# Patient Record
Sex: Female | Born: 1960
Health system: Southern US, Community
[De-identification: ages and names within clinical notes are randomized; demographics above are authoritative.]

## PROBLEM LIST (undated history)

## (undated) DIAGNOSIS — G8929 Other chronic pain: Secondary | ICD-10-CM

## (undated) DIAGNOSIS — E785 Hyperlipidemia, unspecified: Secondary | ICD-10-CM

## (undated) DIAGNOSIS — R112 Nausea with vomiting, unspecified: Secondary | ICD-10-CM

## (undated) DIAGNOSIS — R011 Cardiac murmur, unspecified: Secondary | ICD-10-CM

## (undated) DIAGNOSIS — Z9889 Other specified postprocedural states: Secondary | ICD-10-CM

## (undated) DIAGNOSIS — R0600 Dyspnea, unspecified: Secondary | ICD-10-CM

## (undated) DIAGNOSIS — Q231 Congenital insufficiency of aortic valve: Secondary | ICD-10-CM

## (undated) DIAGNOSIS — Q248 Other specified congenital malformations of heart: Secondary | ICD-10-CM

## (undated) DIAGNOSIS — I480 Paroxysmal atrial fibrillation: Secondary | ICD-10-CM

## (undated) DIAGNOSIS — I350 Nonrheumatic aortic (valve) stenosis: Secondary | ICD-10-CM

## (undated) DIAGNOSIS — M542 Cervicalgia: Secondary | ICD-10-CM

## (undated) DIAGNOSIS — F419 Anxiety disorder, unspecified: Secondary | ICD-10-CM

## (undated) DIAGNOSIS — Q2381 Bicuspid aortic valve: Secondary | ICD-10-CM

## (undated) DIAGNOSIS — R42 Dizziness and giddiness: Secondary | ICD-10-CM

## (undated) HISTORY — DX: Nonrheumatic aortic (valve) stenosis: I35.0

## (undated) HISTORY — DX: Cervicalgia: M54.2

## (undated) HISTORY — DX: Other chronic pain: G89.29

## (undated) HISTORY — DX: Other specified congenital malformations of heart: Q24.8

## (undated) HISTORY — PX: TUBAL LIGATION: SHX77

## (undated) HISTORY — DX: Bicuspid aortic valve: Q23.81

## (undated) HISTORY — DX: Dizziness and giddiness: R42

## (undated) HISTORY — PX: HERNIA REPAIR: SHX51

## (undated) HISTORY — PX: LAPAROSCOPY: SHX197

## (undated) HISTORY — DX: Cardiac murmur, unspecified: R01.1

## (undated) HISTORY — PX: OTHER SURGICAL HISTORY: SHX169

## (undated) HISTORY — DX: Congenital insufficiency of aortic valve: Q23.1

## (undated) HISTORY — DX: Hyperlipidemia, unspecified: E78.5

## (undated) HISTORY — PX: ENDOMETRIAL ABLATION: SHX621

## (undated) HISTORY — DX: Paroxysmal atrial fibrillation: I48.0

## (undated) HISTORY — PX: KNEE SURGERY: SHX244

---

## 2008-02-11 ENCOUNTER — Encounter: Admission: RE | Admit: 2008-02-11 | Discharge: 2008-02-11 | Payer: Self-pay | Admitting: Obstetrics and Gynecology

## 2008-07-02 ENCOUNTER — Ambulatory Visit (HOSPITAL_COMMUNITY): Admission: RE | Admit: 2008-07-02 | Discharge: 2008-07-02 | Payer: Self-pay | Admitting: Obstetrics and Gynecology

## 2008-11-11 ENCOUNTER — Emergency Department (HOSPITAL_COMMUNITY): Admission: EM | Admit: 2008-11-11 | Discharge: 2008-11-11 | Payer: Self-pay | Admitting: Emergency Medicine

## 2010-06-01 ENCOUNTER — Encounter: Payer: Self-pay | Admitting: Cardiovascular Disease

## 2010-06-14 DIAGNOSIS — I4891 Unspecified atrial fibrillation: Secondary | ICD-10-CM

## 2010-06-14 DIAGNOSIS — Z8679 Personal history of other diseases of the circulatory system: Secondary | ICD-10-CM | POA: Insufficient documentation

## 2010-06-14 DIAGNOSIS — I359 Nonrheumatic aortic valve disorder, unspecified: Secondary | ICD-10-CM

## 2010-06-15 ENCOUNTER — Ambulatory Visit: Payer: Self-pay | Admitting: Cardiovascular Disease

## 2010-06-15 DIAGNOSIS — R0602 Shortness of breath: Secondary | ICD-10-CM

## 2010-06-19 ENCOUNTER — Telehealth: Payer: Self-pay | Admitting: Cardiovascular Disease

## 2010-06-19 ENCOUNTER — Encounter (INDEPENDENT_AMBULATORY_CARE_PROVIDER_SITE_OTHER): Payer: Self-pay | Admitting: *Deleted

## 2010-06-22 ENCOUNTER — Ambulatory Visit: Payer: Self-pay | Admitting: Internal Medicine

## 2010-06-22 ENCOUNTER — Ambulatory Visit: Payer: Self-pay

## 2010-06-22 ENCOUNTER — Ambulatory Visit (HOSPITAL_COMMUNITY): Admission: RE | Admit: 2010-06-22 | Discharge: 2010-06-22 | Payer: Self-pay | Admitting: Cardiovascular Disease

## 2010-06-22 ENCOUNTER — Encounter: Payer: Self-pay | Admitting: Cardiovascular Disease

## 2010-06-23 ENCOUNTER — Ambulatory Visit: Payer: Self-pay

## 2010-06-23 ENCOUNTER — Ambulatory Visit: Payer: Self-pay | Admitting: Cardiovascular Disease

## 2010-07-11 ENCOUNTER — Ambulatory Visit (HOSPITAL_COMMUNITY): Admission: RE | Admit: 2010-07-11 | Discharge: 2010-07-11 | Payer: Self-pay | Admitting: Cardiovascular Disease

## 2010-07-11 ENCOUNTER — Ambulatory Visit: Payer: Self-pay | Admitting: Cardiovascular Disease

## 2010-07-12 ENCOUNTER — Encounter: Payer: Self-pay | Admitting: Cardiovascular Disease

## 2010-09-23 ENCOUNTER — Encounter: Payer: Self-pay | Admitting: Cardiovascular Disease

## 2010-09-24 ENCOUNTER — Encounter: Payer: Self-pay | Admitting: Obstetrics and Gynecology

## 2010-10-03 NOTE — Letter (Signed)
Summary: Clearance Letter  [facility], Main Office  [address]   [place], [place] [address]   Phone: [phone]  Fax: [phone]    June 23, 2010  Re:     Molinda Bailiff Address:   [address]     [place], [place]  [address] DOB:     Jun 02, 1961 MRN:     [id number]     Above named pt may proceed with surgery per Dr Eden Emms.      Sincerely, DR PETER NISHAN/ Scherrie Bateman, LPN

## 2010-10-03 NOTE — Letter (Signed)
Summary: [facility] - Office Note  [facility] - Office Note   Imported By: Marylou Mccoy 06/14/2010 15:25:41  _____________________________________________________________________  External Attachment:    Type:   Image     Comment:   External Document

## 2010-10-03 NOTE — Letter (Signed)
Summary: Appointment - Cardiac MRI  [facility], Main Office  [address]   [place], [place] [address]   Phone: [phone]  Fax: [phone]      June 19, 2010 MRN: [id number]   Jessica [name] [address] [place], [place]  [address]   Dear [name],   We have scheduled the above patient for an appointment for a Cardiac MRI on 06-28-2010 at 3:00 p.m.  Please refer to the below information for the location and instructions for this test:  Location:     [facility]       [address]       [place], [place]  [address] Instructions:    Wilmon Arms at [facility] Outpatient Registration 45 minutes prior to your appointment time.  This will ensure you are in the Radiology Department 30 minutes prior to your appointment.    There are no restrictions for this test you may eat and take medications as usual.  If you need to reschedule this appointment please call at the number listed above.  Sincerely,       Lorne Skeens  [facility] Scheduling Team

## 2010-10-03 NOTE — Progress Notes (Signed)
Summary: Question about surgical clearance  Phone Note Call from Patient Call back at Home Phone [phone] Call back at [phone]   Caller: Patient Summary of Call: Pt have question surgical clearance Initial call taken by: Judie Grieve,  June 19, 2010 2:54 PM  Follow-up for Phone Call        left message to call back Dossie Arbour, RN, BSN  June 19, 2010 4:23 PM Pt states she is hoping to have surgery done prior to July 04, 2010 and thought Dr. Eden Emms had cleared her for this. Office note from 06/15/10 indicates pt has been cleared prior to cardiac testing.  This information given to pt and I told her I would call [facility] and give them this information and fax office note to them. Follow-up by: Dossie Arbour, RN, BSN,  June 19, 2010 4:52 PM  Additional Follow-up for Phone Call Additional follow up Details #1::        Spoke with Pattricia Boss at [facility] and gave her above information and faxed office note to her at [phone].  Additional Follow-up by: Dossie Arbour, RN, BSN,  June 19, 2010 5:03 PM

## 2010-10-03 NOTE — Assessment & Plan Note (Signed)
Summary: np3/surgical clearance/jml   CC:  referal from Dr. Corliss Skains surgical clearance.  History of Present Illness: Jessica [name] is seen today at the request of Dr Margaree Mackintosh for preop clearance.  She has a history of bicuspid AV with stenosis and regurgitation.  Has not had echo in 2 years. Some exertional dyspnea and PVC on ECG.  Stopped smoking 2 weeks ago and is on Chantix.  CRF also include family history with mother being a patient of Dr Alice Reichert.  Needs hernia repair which is low risk surgery.  Has never had her aorta looked at for aortopathy.  Despite smoking and family history with dspnea and PVC has not had a previous treadmill.  Denies SSCP, syncope, edema.  Gets occasional palpitations.    Discussed the natural history of bicuspid valve with her.  No need for SBE.  Needs MRI/MRA to look at Aorta.  Would also recommend ETT to R/O CAD and confimr normal functional capacity in the setting of valvular disease.  F/U echo to reassess gradient, and LV size and function.  Current Problems (verified): 1)  Dyspnea  (ICD-786.05) 2)  Aortic Insufficiency  (ICD-424.1) 3)  Atrial Fibrillation  (ICD-427.31) 4)  Heart Murmur, Hx of  (ICD-V12.50)  Current Medications (verified): 1)  Chantix 0.5 Mg Tabs (Varenicline Tartrate) .... As Directed 2)  Multivitamins   Tabs (Multiple Vitamin) .Marland Kitchen.. 1 Tab By Mouth Once Daily  Allergies (verified): 1)  ! Pcn  Past History:  Past Medical History: Last updated: 06/14/2010 Bicuspid AV: prevously seen in [place].  Moderate AS and mild AR PAF  Past Surgical History: Last updated: 06/14/2010 hernia repair  tubal ligation   Two laparoscopies.   Knee surgeries.   Endometrial ablation.      Family History: Last updated: 06/14/2010   Significant for father and mother both with   hypertension.  Mom and Dad both have a heart attack history also.   Social History: Last updated: 06/15/2010 She has quit smoking last year using Chantix.  Married 3  children Moved from [place] as husband is in Youth worker     Social History: She has quit smoking last year using Chantix.  Married 3 children Moved from [place] as husband is in Youth worker     Review of Systems       Denies fever, malais, weight loss, blurry vision, decreased visual acuity, cough, sputum, hemoptysis, pleuritic pain,  heartburn, abdominal pain, melena, lower extremity edema, claudication, or rash.   Vital Signs:  Patient profile:   50 year old female Height:      64 inches Weight:      133 pounds BMI:     22.91 Pulse rate:   65 / minute Resp:     12 per minute BP sitting:   100 / 80  (left arm)  Vitals Entered By: Kem Parkinson (June 15, 2010 12:18 PM)  Physical Exam  General:  Affect appropriate Healthy:  appears stated age HEENT: normal Neck supple with no adenopathy JVP normal no bruits no thyromegaly Lungs clear with no wheezing and good diaphragmatic motion Heart:  S1/S2 mild AS/AR  murmur,rub, gallop or click PMI normal Abdomen: benighn, BS positve, no tenderness, no AAA no bruit.  No HSM or HJR Distal pulses intact with no bruits No edema Neuro non-focal Skin warm and dry    Impression & Recommendations:  Problem # 1:  AORTIC INSUFFICIENCY (ICD-424.1) Bicuspid valve.  F/U echo and MRI/MRA to check aortic root Orders: Cardiac MRI (Cardiac MRI)  Problem #  2:  DYSPNEA (ICD-786.05) Likely from smoking.  F/U ETT given CRF's and PVC on ECG.  Make sure no ischemia, normal functional capacity and no exercise induced arrythmia Orders: Treadmill (Treadmill)  Problem # 3:  PREOPERATIVE EXAMINATION (ICD-V72.84) Low risk surgery.  Can proceed with this even before cardiac testing done.  Encouraged her to stay off cigarettes as this will help with airway and anesthesia  Other Orders: Echocardiogram (Echo)  Patient Instructions: 1)  Your physician recommends that you schedule a follow-up appointment in: YEAR WITH DR Eden Emms 2)  Your  physician recommends that you continue on your current medications as directed. Please refer to the Current Medication list given to you today. 3)  Your physician has requested that you have an echocardiogram.  Echocardiography is a painless test that uses sound waves to create images of your heart. It provides your doctor with information about the size and shape of your heart and how well your heart's chambers and valves are working.  This procedure takes approximately one hour. There are no restrictions for this procedure. 4)  Your physician has requested that you have an exercise tolerance test.  For further information please visit [email or web address]  Please also follow instruction sheet, as given. 5)  Your physician has requested that you have a cardiac MRI.  Cardiac MRI uses a computer to create images of your heart as it's beating, producing both still and moving pictures of your heart and major blood vessels. For further information please visit  [email or web address]  Please follow the instruction sheet given to you today for more information. AND AORTIC MRA    EKG Report  Procedure date:  06/15/2010  Findings:      NSR 65 PVC Otherwise normal   Appended Document: np3/surgical clearance/jml moderate AS/AR with normal LV  Ok for surgery.  Appended Document: np3/surgical clearance/jml CLEARANCE NOTE FAXED .Zack Seal

## 2010-10-03 NOTE — Miscellaneous (Signed)
  Clinical Lists Changes  Orders: Added new Referral order of MRA (MRA) - Signed

## 2010-10-03 NOTE — Progress Notes (Signed)
Summary: cardiac clearance  Phone Note From Other Clinic   Caller: annie [phone] Request: Talk with Nurse Details of Complaint: status of cardiac clearance Initial call taken by: Lorne Skeens,  June 19, 2010 12:01 PM  Follow-up for Phone Call        spoke w/Annie pt is sch for stress test and echo on 10/21 [facility], RN  June 19, 2010 12:25 PM

## 2010-12-14 LAB — POCT I-STAT, CHEM 8
Creatinine, Ser: 0.9 mg/dL (ref 0.4–1.2)
HCT: 43 % (ref 36.0–46.0)
Hemoglobin: 14.6 g/dL (ref 12.0–15.0)
Potassium: 4 mEq/L (ref 3.5–5.1)
Sodium: 136 mEq/L (ref 135–145)
TCO2: 22 mmol/L (ref 0–100)

## 2010-12-14 LAB — CBC
MCV: 100.5 fL — ABNORMAL HIGH (ref 78.0–100.0)
Platelets: 254 10*3/uL (ref 150–400)
RDW: 13.7 % (ref 11.5–15.5)
WBC: 5.5 10*3/uL (ref 4.0–10.5)

## 2010-12-14 LAB — DIFFERENTIAL
Basophils Absolute: 0.1 10*3/uL (ref 0.0–0.1)
Basophils Relative: 1 % (ref 0–1)
Eosinophils Absolute: 0.1 10*3/uL (ref 0.0–0.7)
Lymphs Abs: 1.5 10*3/uL (ref 0.7–4.0)
Neutrophils Relative %: 62 % (ref 43–77)

## 2010-12-14 LAB — GC/CHLAMYDIA PROBE AMP, GENITAL
Chlamydia, DNA Probe: NEGATIVE
GC Probe Amp, Genital: NEGATIVE

## 2010-12-14 LAB — URINALYSIS, ROUTINE W REFLEX MICROSCOPIC
Glucose, UA: NEGATIVE mg/dL
Hgb urine dipstick: NEGATIVE
Specific Gravity, Urine: 1.027 (ref 1.005–1.030)
Urobilinogen, UA: 0.2 mg/dL (ref 0.0–1.0)

## 2010-12-14 LAB — RPR: RPR Ser Ql: NONREACTIVE

## 2010-12-14 LAB — URINE MICROSCOPIC-ADD ON

## 2011-01-16 NOTE — Op Note (Signed)
NAMEZAYNA, Jessica [name]              ACCOUNT NO.:  [id number]   MEDICAL RECORD NO.:  [id number]          PATIENT TYPE:  AMB   LOCATION:  SDC                           FACILITY:  [facility]   PHYSICIAN:  Duke Salvia. Marcelle [name], M.D.DATE OF BIRTH:  [birth year]   DATE OF PROCEDURE:  07/02/2008  DATE OF DISCHARGE:                               OPERATIVE REPORT   PREOPERATIVE DIAGNOSIS:  Stress urinary incontinence.   POSTOPERATIVE DIAGNOSIS:  Stress urinary incontinence.   PROCEDURE:  Midurethral sling, transobturator.   SURGEON:  Duke Salvia. Marcelle Overlie, MD   ANESTHESIA:  General.   COMPLICATIONS:  None.   DRAINS:  Foley catheter.   BLOOD LOSS:  150.   PROCEDURE AND FINDINGS:  The patient was taken to the operating room.  After an adequate level of general anesthesia was obtained with the legs  in stirrups, the perineum and vagina were prepped and draped with  Betadine.  A Foley catheter was positioned, draining clear urine.  Exam  was carried out at that point and the appropriate needle sites for the  transobturator were marked at the level of clitoris along the medial  margin of the obturator canal.  The midurethral area was infiltrated  with dilute lidocaine with epinephrine.  A 2-cm vertical incision was  made, minimal sharp dissection, finger dissection until I could palpate  the posterior side of the inferior pubic ramus.  The halo needle was  then used to pass from the obturator canal on each side.  After assuring  that there was no buttonholing of vaginal tissue, Foley catheter was  removed and cystoscopy was carried out revealing no evidence of bladder  or urethral injury.  The cystoscope was removed.  The bladder was  deflated with the Foley catheter.  The mesh assembly was pulled through,  appropriate tensioning was made, and the mesh was trimmed.  Needle sites  were closed with Dermabond and the midurethral area was closed with  interrupted 2-0 Vicryl sutures.  This area was  hemostatic.  Clear urine  noted throughout.  She received Cipro IV preop and went to recovery room  in good condition.      Jessica [name], M.D.  Electronically Signed     RMH/MEDQ  D:  07/02/2008  T:  07/02/2008  Job:  [id number]

## 2011-01-16 NOTE — H&P (Signed)
Jessica [name], [name]              ACCOUNT NO.:  [id number]   MEDICAL RECORD NO.:  [id number]          PATIENT TYPE:  AMB   LOCATION:  SDC                           [facility]ILITY:  [facility]   PHYSICIAN:  Duke Salvia. Marcelle Overlie, M.D.DATE OF BIRTH:  1960-11-27   DATE OF ADMISSION:  DATE OF DISCHARGE:                              HISTORY & PHYSICAL   CHIEF COMPLAINT:  Stress urinary incontinence.   HISTORY OF PRESENT ILLNESS:  A 50 year old G3, P3, prior tubal.  I saw  her originally August 2008.  At that time, she was complaining of mild  SUI symptoms.  Her primary concern was menorrhagia.  She underwent a  ThermaChoice EMA in the office September 26, 2007 and has improved  significantly as far as her menorrhagia is concerned, but has continued  to be bothered by urinary incontinence.   Evaluation in our office by urodynamics showed PVR 50 mL.  Her Chem-9  was normal and normal uroflow.  LPPs ranged from 32-141 with an MUCP of  29-30.  There were no uninhibited contractions, normal capacity and she  did leak with a full bladder with Valsalva and cough.  She presents now  for TOT.  This procedure was discussed in detail including risks related  to bleeding, infection, adjacent organ injury, possible complications  including infection or mesh erosion discussed.  The need for postop  catheterization as a possibility along with postop bladder irritability  and her expected recovery time are all reviewed.   ALLERGIES:  PENICILLIN.   PAST SURGERIES:  1. Two laparoscopies.  2. Knee surgeries.  3. Tubal ligation.  4. Endometrial ablation.   SOCIAL HISTORY:  She has quit smoking last year using Chantix.   OBSTETRICAL HISTORY:  Three vaginal deliveries at term without  complication.   FAMILY HISTORY:  Significant for father and mother both with  hypertension.  Mom and Dad both have a heart attack history also.   PHYSICAL EXAMINATION:  VITAL SIGNS:  Temperature 98.2, blood pressure  120/78.  HEENT:  Unremarkable.  NECK:  Supple without masses.  LUNGS:  Clear.  CARDIOVASCULAR:  Regular rate and rhythm without murmurs, rubs or  gallops.  BREASTS:  Without masses.  ABDOMEN:  Soft, flat, nontender.  PELVIC:  Normal external genitalia.  Vagina and _cervix_are  clear.  Uterus midposition, normal size.  Adnexa negative.  EXTREMITIES/NEUROLOGIC:  Unremarkable.   IMPRESSION:  Stress urinary incontinence.   PLAN:  TOT procedure and risks reviewed as above.      Richard M. Marcelle Overlie, M.D.  Electronically Signed     RMH/MEDQ  D:  07/01/2008  T:  07/01/2008  Job:  [id number]

## 2011-03-20 ENCOUNTER — Encounter: Payer: Self-pay | Admitting: Cardiovascular Disease

## 2011-03-21 ENCOUNTER — Ambulatory Visit: Payer: Self-pay | Admitting: Cardiovascular Disease

## 2011-06-05 LAB — CBC
HCT: 42.5
Hemoglobin: 14.4
MCHC: 33.8
MCV: 99.7
RBC: 4.27
WBC: 5.4

## 2011-06-08 ENCOUNTER — Encounter: Payer: Self-pay | Admitting: Cardiovascular Disease

## 2011-06-08 ENCOUNTER — Ambulatory Visit (INDEPENDENT_AMBULATORY_CARE_PROVIDER_SITE_OTHER): Payer: BC Managed Care – PPO | Admitting: Cardiovascular Disease

## 2011-06-08 VITALS — BP 110/80 | HR 64 | Ht 64.0 in | Wt 130.0 lb

## 2011-06-08 DIAGNOSIS — F17200 Nicotine dependence, unspecified, uncomplicated: Secondary | ICD-10-CM

## 2011-06-08 DIAGNOSIS — I350 Nonrheumatic aortic (valve) stenosis: Secondary | ICD-10-CM

## 2011-06-08 DIAGNOSIS — I359 Nonrheumatic aortic valve disorder, unspecified: Secondary | ICD-10-CM

## 2011-06-08 MED ORDER — VARENICLINE TARTRATE 0.5 MG X 11 & 1 MG X 42 PO MISC: ORAL | Status: DC

## 2011-06-08 NOTE — Patient Instructions (Signed)
Your physician has requested that you have an echocardiogram. Echocardiography is a painless test that uses sound waves to create images of your heart. It provides your doctor with information about the size and shape of your heart and how well your heart's chambers and valves are working. This procedure takes approximately one hour. There are no restrictions for this procedure.   Your physician wants you to follow-up in: one year You will receive a reminder letter in the mail two months in advance. If you don't receive a letter, please call our office to schedule the follow-up appointment.   Start chantix as directed

## 2011-06-08 NOTE — Assessment & Plan Note (Signed)
Counseled for less than 10 minutes.  Chantix written for.  Stress from looking after her 2 grandchildren got her smoking again last 2 months.  Daughter has substance abuse problem.

## 2011-06-08 NOTE — Progress Notes (Signed)
Arleigh is seen today at the request of Dr Margaree Mackintosh for preop clearance. She has a history of bicuspid AV with stenosis and regurgitation. Has not had echo in 2 years. Some exertional dyspnea and PVC on ECG. Stopped smoking 2 weeks ago and is on Chantix. CRF also include family history with mother being a patient of Dr Alice Reichert. Needs hernia repair which is low risk surgery. Has never had her aorta looked at for aortopathy. Despite smoking and family history with dspnea and PVC has not had a previous treadmill. Denies SSCP, syncope, edema. Gets occasional palpitations.   Echo 06/22/10 Reviewed  - Left ventricle: The cavity size was normal. Wall thickness was normal. Systolic function was normal. The estimated ejection fraction was in the range of 55% to 60%. Wall motion was normal; there were no regional wall motion abnormalities. Left ventricular diastolic function parameters were normal. - Aortic valve: Bicuspid; mildly calcified leaflets. There was moderate stenosis. Moderate regurgitation. Valve area: 1.47cm^2(VTI). Valve area: 1.27cm^2 (Vmax). - Mitral valve: Mild regurgitation. - Left atrium: The atrium was mildly dilated. - Tricuspid valve: Moderate regurgitation. - Pulmonary arteries: Systolic pressure was mildly to moderately increased. PA peak pressure: 38mm Hg (S).  MRI: 07/11/10  REviewed  Impression:  1) Bicuspid Aortic Valve  2) Moderate dilatation of the ascending aortic root with no discection or ulcer Measures 4.3 x 3.8 cm compared to descending thoracic aorta at 2.2 x 2.2 cm 3) No coarctation of the aorta         ROS: Denies fever, malais, weight loss, blurry vision, decreased visual acuity, cough, sputum, SOB, hemoptysis, pleuritic pain, palpitaitons, heartburn, abdominal pain, melena, lower extremity edema, claudication, or rash.  All other systems reviewed and negative  General: Affect appropriate Healthy:  appears stated age HEENT: normal Neck supple with no  adenopathy JVP normal no bruits no thyromegaly Lungs clear with no wheezing and good diaphragmatic motion Heart:  S1/S2 audible AS  murmur,rub, gallop or click PMI normal Abdomen: benighn, BS positve, no tenderness, no AAA no bruit.  No HSM or HJR Distal pulses intact with no bruits No edema Neuro non-focal Skin warm and dry No muscular weakness   Current Outpatient Prescriptions  Medication Sig Dispense Refill  . multivitamin (THERAGRAN) per tablet Take 1 tablet by mouth daily.          Allergies  Penicillins  Electrocardiogram:  NSR 64 Normal ECG  Assessment and Plan

## 2011-06-08 NOTE — Assessment & Plan Note (Signed)
Bicuspid AV with moderate AS/AR.  F/U echo

## 2011-06-19 ENCOUNTER — Encounter: Payer: Self-pay | Admitting: *Deleted

## 2011-06-20 ENCOUNTER — Ambulatory Visit (HOSPITAL_COMMUNITY): Payer: BC Managed Care – PPO | Attending: Cardiovascular Disease | Admitting: Radiology

## 2011-06-20 DIAGNOSIS — I059 Rheumatic mitral valve disease, unspecified: Secondary | ICD-10-CM

## 2011-06-20 DIAGNOSIS — I079 Rheumatic tricuspid valve disease, unspecified: Secondary | ICD-10-CM | POA: Insufficient documentation

## 2011-06-20 DIAGNOSIS — I350 Nonrheumatic aortic (valve) stenosis: Secondary | ICD-10-CM

## 2011-06-20 DIAGNOSIS — I080 Rheumatic disorders of both mitral and aortic valves: Secondary | ICD-10-CM | POA: Insufficient documentation

## 2011-06-20 DIAGNOSIS — R002 Palpitations: Secondary | ICD-10-CM | POA: Insufficient documentation

## 2011-06-20 DIAGNOSIS — I379 Nonrheumatic pulmonary valve disorder, unspecified: Secondary | ICD-10-CM | POA: Insufficient documentation

## 2011-07-20 ENCOUNTER — Telehealth: Payer: Self-pay | Admitting: Cardiovascular Disease

## 2011-07-20 MED ORDER — VARENICLINE TARTRATE 0.5 MG X 11 & 1 MG X 42 PO MISC: ORAL | Status: DC

## 2011-07-20 NOTE — Telephone Encounter (Signed)
Pt calling regarding RX for chantix and pt said she cannot find RX anywhere. Pt waned to know if RX can be sent over the mail or if pt can come to pick it up in the office. Please return pt call to discuss further.

## 2011-07-20 NOTE — Telephone Encounter (Signed)
Spoke with pt, script mailed to her home address Jessica [name]

## 2011-09-20 ENCOUNTER — Other Ambulatory Visit: Payer: Self-pay | Admitting: *Deleted

## 2011-09-20 MED ORDER — VARENICLINE TARTRATE 1 MG PO TABS: 1.0000 mg | ORAL_TABLET | Freq: Two times a day (BID) | ORAL | Status: AC

## 2012-06-25 ENCOUNTER — Other Ambulatory Visit: Payer: Self-pay | Admitting: Internal Medicine

## 2012-06-25 ENCOUNTER — Other Ambulatory Visit (HOSPITAL_COMMUNITY)
Admission: RE | Admit: 2012-06-25 | Discharge: 2012-06-25 | Disposition: A | Discharge status: Home / Self Care | Payer: BC Managed Care – PPO | Source: Ambulatory Visit | Attending: Internal Medicine | Admitting: Internal Medicine

## 2012-06-25 DIAGNOSIS — Z1151 Encounter for screening for human papillomavirus (HPV): Secondary | ICD-10-CM | POA: Insufficient documentation

## 2012-06-25 DIAGNOSIS — R1900 Intra-abdominal and pelvic swelling, mass and lump, unspecified site: Secondary | ICD-10-CM

## 2012-06-25 DIAGNOSIS — Z01419 Encounter for gynecological examination (general) (routine) without abnormal findings: Secondary | ICD-10-CM | POA: Insufficient documentation

## 2012-06-25 DIAGNOSIS — R0989 Other specified symptoms and signs involving the circulatory and respiratory systems: Secondary | ICD-10-CM

## 2012-07-04 ENCOUNTER — Other Ambulatory Visit: Payer: BC Managed Care – PPO

## 2012-07-07 ENCOUNTER — Other Ambulatory Visit: Payer: BC Managed Care – PPO

## 2012-07-11 ENCOUNTER — Ambulatory Visit
Admission: RE | Admit: 2012-07-11 | Discharge: 2012-07-11 | Disposition: A | Discharge status: Home / Self Care | Payer: BC Managed Care – PPO | Source: Ambulatory Visit | Attending: Internal Medicine | Admitting: Internal Medicine

## 2012-07-11 DIAGNOSIS — R1900 Intra-abdominal and pelvic swelling, mass and lump, unspecified site: Secondary | ICD-10-CM

## 2012-07-11 DIAGNOSIS — R0989 Other specified symptoms and signs involving the circulatory and respiratory systems: Secondary | ICD-10-CM

## 2012-07-17 ENCOUNTER — Encounter: Payer: Self-pay | Admitting: Cardiovascular Disease

## 2012-11-06 ENCOUNTER — Other Ambulatory Visit: Payer: Self-pay | Admitting: Internal Medicine

## 2013-01-12 ENCOUNTER — Other Ambulatory Visit: Payer: BC Managed Care – PPO

## 2013-06-15 ENCOUNTER — Other Ambulatory Visit: Payer: BC Managed Care – PPO

## 2013-06-22 ENCOUNTER — Ambulatory Visit
Admission: RE | Admit: 2013-06-22 | Discharge: 2013-06-22 | Disposition: A | Discharge status: Home / Self Care | Payer: BC Managed Care – PPO | Source: Ambulatory Visit | Attending: Internal Medicine | Admitting: Internal Medicine

## 2013-06-22 DIAGNOSIS — E041 Nontoxic single thyroid nodule: Secondary | ICD-10-CM

## 2013-06-24 ENCOUNTER — Other Ambulatory Visit: Payer: Self-pay

## 2013-06-24 DIAGNOSIS — Z1231 Encounter for screening mammogram for malignant neoplasm of breast: Secondary | ICD-10-CM

## 2013-07-03 ENCOUNTER — Ambulatory Visit (INDEPENDENT_AMBULATORY_CARE_PROVIDER_SITE_OTHER): Payer: BC Managed Care – PPO | Admitting: Cardiovascular Disease

## 2013-07-03 ENCOUNTER — Encounter: Payer: Self-pay | Admitting: Cardiovascular Disease

## 2013-07-03 VITALS — BP 119/76 | HR 69 | Ht 64.0 in | Wt 135.0 lb

## 2013-07-03 DIAGNOSIS — I719 Aortic aneurysm of unspecified site, without rupture: Secondary | ICD-10-CM

## 2013-07-03 DIAGNOSIS — I359 Nonrheumatic aortic valve disorder, unspecified: Secondary | ICD-10-CM

## 2013-07-03 DIAGNOSIS — Z9109 Other allergy status, other than to drugs and biological substances: Secondary | ICD-10-CM | POA: Insufficient documentation

## 2013-07-03 DIAGNOSIS — Q231 Congenital insufficiency of aortic valve: Secondary | ICD-10-CM

## 2013-07-03 DIAGNOSIS — Q2381 Bicuspid aortic valve: Secondary | ICD-10-CM

## 2013-07-03 DIAGNOSIS — I350 Nonrheumatic aortic (valve) stenosis: Secondary | ICD-10-CM

## 2013-07-03 NOTE — Progress Notes (Signed)
Patient ID: Jessica [name], female   DOB: [birth year], 52 y.o.   MRN: [id number] Dennis is seen today at the request of Dr Margaree Mackintosh for preop clearance. She has a history of bicuspid AV with stenosis and regurgitation. Has not had echo in 2 years. Some exertional dyspnea and PVC on ECG. Stopped smoking 2 weeks ago and is on Chantix. CRF also include family history with mother being a patient of Dr Alice Reichert. Needs hernia repair which is low risk surgery. Has never had her aorta looked at for aortopathy. Despite smoking and family history with dspnea and PVC has not had a previous treadmill. Denies SSCP, syncope, edema. Gets occasional palpitations.  Echo  I0/17/12   Study Conclusions  - Left ventricle: The cavity size was normal. Wall thickness was normal. Systolic function was normal. The estimated ejection fraction was in the range of 55% to 60%. - Aortic valve: Likely bicuspid valve moderately calcified There was moderate stenosis. Mild regurgitation. - Mitral valve: Mild regurgitation. - Left atrium: The atrium was mildly dilated. - Atrial septum: No defect or patent foramen ovale was identified.  Bothered by allergies.  Still raising grandkids as daughter in in Rx for heroin   ROS: Denies fever, malais, weight loss, blurry vision, decreased visual acuity, cough, sputum, SOB, hemoptysis, pleuritic pain, palpitaitons, heartburn, abdominal pain, melena, lower extremity edema, claudication, or rash.  All other systems reviewed and negative  General: Affect appropriate Healthy:  appears stated age HEENT: normal  Bronchitic voice Neck supple with no adenopathy JVP normal no bruits no thyromegaly Lungs clear with no wheezing and good diaphragmatic motion Heart:  S1/S2 mild AS murmur, no rub, gallop or click PMI normal Abdomen: benighn, BS positve, no tenderness, no AAA no bruit.  No HSM or HJR Distal pulses intact with no bruits No edema Neuro non-focal Skin warm and dry No muscular  weakness   Current Outpatient Prescriptions  Medication Sig Dispense Refill  . multivitamin (THERAGRAN) per tablet Take 1 tablet by mouth daily.         No current facility-administered medications for this visit.    Allergies  Penicillins  Electrocardiogram:  SR rate 69 normal   Assessment and Plan

## 2013-07-03 NOTE — Assessment & Plan Note (Signed)
Bicuspid AV last echo 2 years ago moderate stenosis with mild AR  F/U echo Discussed need to see dentist twice/year  Dilated ascending aorta 3 years ago 4.3 cm Needs f/u cardiac MRI/ Aortic MRA to size

## 2013-07-03 NOTE — Assessment & Plan Note (Signed)
Resolved NSR on ECG and no palpitations

## 2013-07-03 NOTE — Assessment & Plan Note (Signed)
Fall allergies and ragweed.  Discussed allegra and claritin as alternatives to zyrtec that makes her sleepy

## 2013-07-03 NOTE — Patient Instructions (Signed)
Your physician wants you to follow-up in:  YEAR WITH DR Haywood Filler will receive a reminder letter in the mail two months in advance. If you don't receive a letter, please call our office to schedule the follow-up appointment. Your physician recommends that you continue on your current medications as directed. Please refer to the Current Medication list given to you today.  Your physician has requested that you have an echocardiogram. Echocardiography is a painless test that uses sound waves to create images of your heart. It provides your doctor with information about the size and shape of your heart and how well your heart's chambers and valves are working. This procedure takes approximately one hour. There are no restrictions for this procedure.   Your physician has requested that you have a cardiac MRI. Cardiac MRI uses a computer to create images of your heart as its beating, producing both still and moving pictures of your heart and major blood vessels. For further information please visit [email or web address]. Please follow the instruction sheet given to you today for more information. AND MRA  NEEDS  DONE THIS  YEAR

## 2013-07-07 ENCOUNTER — Ambulatory Visit (INDEPENDENT_AMBULATORY_CARE_PROVIDER_SITE_OTHER): Payer: BC Managed Care – PPO | Admitting: General Surgery

## 2013-07-17 ENCOUNTER — Ambulatory Visit (HOSPITAL_COMMUNITY): Payer: BC Managed Care – PPO | Attending: Cardiology | Admitting: Radiology

## 2013-07-17 ENCOUNTER — Encounter: Payer: Self-pay | Admitting: Cardiology

## 2013-07-17 DIAGNOSIS — Q231 Congenital insufficiency of aortic valve: Secondary | ICD-10-CM | POA: Insufficient documentation

## 2013-07-17 DIAGNOSIS — I350 Nonrheumatic aortic (valve) stenosis: Secondary | ICD-10-CM

## 2013-07-17 DIAGNOSIS — I719 Aortic aneurysm of unspecified site, without rupture: Secondary | ICD-10-CM | POA: Insufficient documentation

## 2013-07-17 DIAGNOSIS — F17200 Nicotine dependence, unspecified, uncomplicated: Secondary | ICD-10-CM | POA: Insufficient documentation

## 2013-07-17 DIAGNOSIS — I359 Nonrheumatic aortic valve disorder, unspecified: Secondary | ICD-10-CM

## 2013-07-17 DIAGNOSIS — I079 Rheumatic tricuspid valve disease, unspecified: Secondary | ICD-10-CM | POA: Insufficient documentation

## 2013-07-17 DIAGNOSIS — Q2381 Bicuspid aortic valve: Secondary | ICD-10-CM

## 2013-07-17 DIAGNOSIS — R0602 Shortness of breath: Secondary | ICD-10-CM

## 2013-07-17 NOTE — Progress Notes (Signed)
Echocardiogram performed.  

## 2013-07-21 ENCOUNTER — Encounter: Payer: Self-pay | Admitting: Cardiovascular Disease

## 2013-07-24 ENCOUNTER — Ambulatory Visit (HOSPITAL_COMMUNITY): Payer: BC Managed Care – PPO

## 2013-07-27 ENCOUNTER — Ambulatory Visit
Admission: RE | Admit: 2013-07-27 | Discharge: 2013-07-27 | Disposition: A | Discharge status: Home / Self Care | Payer: BC Managed Care – PPO | Source: Ambulatory Visit

## 2013-07-27 DIAGNOSIS — Z1231 Encounter for screening mammogram for malignant neoplasm of breast: Secondary | ICD-10-CM

## 2013-08-05 ENCOUNTER — Ambulatory Visit (HOSPITAL_COMMUNITY)
Admission: RE | Admit: 2013-08-05 | Discharge: 2013-08-05 | Disposition: A | Discharge status: Home / Self Care | Payer: BC Managed Care – PPO | Source: Ambulatory Visit | Attending: Cardiovascular Disease | Admitting: Cardiovascular Disease

## 2013-08-05 ENCOUNTER — Ambulatory Visit (HOSPITAL_COMMUNITY): Payer: BC Managed Care – PPO

## 2013-08-05 DIAGNOSIS — Q231 Congenital insufficiency of aortic valve: Secondary | ICD-10-CM

## 2013-08-05 DIAGNOSIS — I359 Nonrheumatic aortic valve disorder, unspecified: Secondary | ICD-10-CM

## 2013-08-05 DIAGNOSIS — I77819 Aortic ectasia, unspecified site: Secondary | ICD-10-CM | POA: Insufficient documentation

## 2013-08-05 DIAGNOSIS — I719 Aortic aneurysm of unspecified site, without rupture: Secondary | ICD-10-CM

## 2013-08-05 DIAGNOSIS — I350 Nonrheumatic aortic (valve) stenosis: Secondary | ICD-10-CM

## 2013-08-05 LAB — CREATININE, SERUM
Creatinine, Ser: 0.81 mg/dL (ref 0.50–1.10)
GFR calc non Af Amer: 83 mL/min — ABNORMAL LOW (ref >90)

## 2013-08-05 MED ORDER — GADOBENATE DIMEGLUMINE 529 MG/ML IV SOLN
20.0000 mL | Freq: Once | INTRAVENOUS | Status: AC
  Administered 2013-08-05: 20 mL via INTRAVENOUS

## 2013-08-06 ENCOUNTER — Ambulatory Visit (INDEPENDENT_AMBULATORY_CARE_PROVIDER_SITE_OTHER): Payer: BC Managed Care – PPO | Admitting: General Surgery

## 2013-08-18 ENCOUNTER — Encounter (INDEPENDENT_AMBULATORY_CARE_PROVIDER_SITE_OTHER): Payer: Self-pay

## 2013-08-18 ENCOUNTER — Ambulatory Visit (INDEPENDENT_AMBULATORY_CARE_PROVIDER_SITE_OTHER): Payer: BC Managed Care – PPO | Admitting: General Surgery

## 2013-08-18 ENCOUNTER — Encounter (INDEPENDENT_AMBULATORY_CARE_PROVIDER_SITE_OTHER): Payer: Self-pay | Admitting: General Surgery

## 2013-08-18 VITALS — BP 112/80 | HR 71 | Temp 97.0°F | Resp 16 | Ht 64.0 in | Wt 134.8 lb

## 2013-08-18 DIAGNOSIS — K429 Umbilical hernia without obstruction or gangrene: Secondary | ICD-10-CM

## 2013-08-18 NOTE — Progress Notes (Signed)
Patient ID: Jessica [name], female   DOB: April 14, 1961, 52 y.o.   MRN: [id number]  No chief complaint on file.   HPI Jessica [name] is a 52 y.o. female.  The patient is a 52 year old female who is referred by Dr. Renne Crigler for evaluation of a recurrent umbilical hernia. He states that she can have this fixed previously and states that it was likely not repaired with a mesh  And only repaired with a suture. The patient she has minimal pain at this time. The patient like to have her hernia fixed prior to the end of the year.  Patient recently saw Dr. Eden Emms 07/04/23 .  HPI  Past Medical History  Diagnosis Date  . Bicuspid aortic valve     Previously seen in [place]; moderate AS and mild AR  . Paroxysmal atrial fibrillation   . Heart murmur     Past Surgical History  Procedure Laterality Date  . Hernia repair    . Tubal ligation    . Laparoscopy      x2  . Knee surgery      Multiple  . Endometrial ablation      Family History  Problem Relation Age of Onset  . Hypertension Mother   . Heart attack Mother   . Hypertension Father   . Heart attack Father     Social History History  Substance Use Topics  . Smoking status: Current Some Day Smoker    Last Attempt to Quit: 09/03/2009  . Smokeless tobacco: Not on file     Comment: Quit using Chantix.  Marland Kitchen Alcohol Use: Yes    Allergies  Allergen Reactions  . Penicillins     REACTION: hives    Current Outpatient Prescriptions  Medication Sig Dispense Refill  . multivitamin (THERAGRAN) per tablet Take 1 tablet by mouth daily.         No current facility-administered medications for this visit.    Review of Systems Review of Systems  Constitutional: Negative.   HENT: Negative.   Respiratory: Negative.   Cardiovascular: Negative.   Gastrointestinal: Negative.   Neurological: Negative.   All other systems reviewed and are negative.    Blood pressure 112/80, pulse 71, temperature 97 F (36.1 C), temperature source  Temporal, resp. rate 16, height 5\' 4"  (1.626 m), weight 134 lb 12.8 oz (61.145 kg).  Physical Exam Physical Exam  Constitutional: She is oriented to person, place, and time. She appears well-developed and well-nourished.  HENT:  Head: Normocephalic and atraumatic.  Eyes: Conjunctivae and EOM are normal. Pupils are equal, round, and reactive to light.  Neck: Normal range of motion. Neck supple.  Cardiovascular: Normal rate, regular rhythm and normal heart sounds.   Pulmonary/Chest: Effort normal and breath sounds normal.  Abdominal: Soft. Bowel sounds are normal. There is no tenderness. There is no rebound and no guarding.    Musculoskeletal: Normal range of motion.  Neurological: She is alert and oriented to person, place, and time.  Skin: Skin is warm and dry.  Psychiatric: She has a normal mood and affect.    Data Reviewed none  Assessment    52 year old female with a recurrent umbilical hernia     Plan    1. We will  Have a clearance form sent to Dr. Fabio Bering  2. Once this has been received we can schedule patient opened up in the year. Should she not be able to sit up near she would like to wait another one to 2 years. I  did not see any immediate urgency to get this fixed at this time as it is small and given her minimal pain. 3. If we are able to get this scheduled to proceed with a laparoscopic umbilical hernia repair with mesh.  4. All risks and benefits were discussed with the patient, to generally include infection, bleeding, damage to surrounding structures, acute and chronic nerve pain, and recurrence. Alternatives were offered and described.  All questions were answered and the patient voiced understanding of the procedure and wishes to proceed at this point.        Marigene Ehlers., Savir Blanke 08/18/2013, 3:34 PM

## 2013-08-19 ENCOUNTER — Encounter (INDEPENDENT_AMBULATORY_CARE_PROVIDER_SITE_OTHER): Payer: Self-pay

## 2013-08-19 NOTE — Progress Notes (Signed)
Message     Ok for surgery        ----- Message -----    From: Maryan Puls, CMA    Sent: 08/18/2013 3:51 PM    To: Wendall Stade, MD                         Charisse March Snead Description: 52 year old female  08/18/2013 Letter (Out) Provider: Maryan Puls, CMA  MRN: [id number] Department: [facility]          Clinical Letter Summary              Letters     Letter Information       Status     Maryan Puls on 08/18/2013 Documentation: Pre-Op Cardiac Clearance Sent                  Patient Demographics     Address Phone    [address] [place] [address] [phone] [facility]) [phone] (Mobile)

## 2014-09-28 ENCOUNTER — Other Ambulatory Visit (HOSPITAL_COMMUNITY)
Admission: RE | Admit: 2014-09-28 | Discharge: 2014-09-28 | Disposition: A | Discharge status: Home / Self Care | Payer: BLUE CROSS/BLUE SHIELD | Source: Ambulatory Visit | Attending: Internal Medicine | Admitting: Internal Medicine

## 2014-09-28 DIAGNOSIS — Z01419 Encounter for gynecological examination (general) (routine) without abnormal findings: Secondary | ICD-10-CM | POA: Diagnosis present

## 2014-09-28 DIAGNOSIS — R87810 Cervical high risk human papillomavirus (HPV) DNA test positive: Secondary | ICD-10-CM | POA: Insufficient documentation

## 2014-09-28 DIAGNOSIS — Z1151 Encounter for screening for human papillomavirus (HPV): Secondary | ICD-10-CM | POA: Insufficient documentation

## 2014-11-24 ENCOUNTER — Ambulatory Visit: Payer: BLUE CROSS/BLUE SHIELD | Admitting: *Deleted

## 2015-01-30 NOTE — Progress Notes (Signed)
Patient ID: Jessica [name], female   DOB: 03-18-1961, 54 y.o.   MRN: [id number] Jessica [name] is seen today for f/u aortic stenosis . She has a history of bicuspid AV with stenosis and regurgitation. Has not had echo in 2 years.  CRF also include family history with mother being a patient of Dr Dinah Beers.  Echo 07/17/13 reviewed: Study Conclusions  - Left ventricle: The cavity size was normal. Wall thickness was normal. Systolic function was normal. The estimated ejection fraction was in the range of 60% to 65%. Wall motion was normal; there were no regional wall motion abnormalities. Doppler parameters are consistent with abnormal left ventricular relaxation (grade 1 diastolic dysfunction). - Aortic valve: Bicuspid; moderately calcified leaflets. There was moderate stenosis. Mild regurgitation. Mean gradient: 17mm Hg (S). Peak gradient: 53mm Hg (S). Valve area: 1cm^2(VTI). - Aorta: Mildly dilated ascending aorta. Ascending aortic diameter: 55mm (S). - Mitral valve: No significant regurgitation. - Left atrium: The atrium was mildly dilated. - Right ventricle: The cavity size was normal. Systolic function was normal. - Tricuspid valve: Peak RV-RA gradient: 34mm Hg (S). - Pulmonary arteries: PA systolic pressure 21-97 mmHg. - Systemic veins: The IVC measured 1.9 with normal respirophasic variation, suggesting RA pressure 6-10 mmHg  MRI 08/05/13 reviewed IMPRESSION: 1) Mild ascending aortic root dilatation 4.2 cm stable from study done 2011  2) Bicuspid aortic valve Moderate stenosis by echo with mild AR  3) Normal LV size and function EF 59%  4) No coarctation with bovine arch  5) No hyperenhancement or scar tissue in LV myocardium   ROS: Denies fever, malais, weight loss, blurry vision, decreased visual acuity, cough, sputum, SOB, hemoptysis, pleuritic pain, palpitaitons, heartburn, abdominal pain, melena, lower extremity edema, claudication, or rash.  All  other systems reviewed and negative  General: Affect appropriate Healthy:  appears stated age 55: normal  Bronchitic voice Neck supple with no adenopathy JVP normal no bruits no thyromegaly Lungs clear with no wheezing and good diaphragmatic motion Heart:  S1/S2 moderate  AS murmur, no rub, gallop or click PMI normal Abdomen: benighn, BS positve, no tenderness, no AAA no bruit.  No HSM or HJR Distal pulses intact with no bruits No edema Neuro non-focal Skin warm and dry No muscular weakness   Current Outpatient Prescriptions  Medication Sig Dispense Refill  . multivitamin (THERAGRAN) per tablet Take 1 tablet by mouth daily.       No current facility-administered medications for this visit.    Allergies  Penicillins  Electrocardiogram:  07/03/13 SR rate 69 normal   Assessment and Plan

## 2015-02-02 ENCOUNTER — Encounter: Payer: BLUE CROSS/BLUE SHIELD | Admitting: Cardiovascular Disease

## 2015-04-20 ENCOUNTER — Ambulatory Visit: Payer: BLUE CROSS/BLUE SHIELD | Admitting: Cardiovascular Disease

## 2015-04-26 NOTE — Progress Notes (Signed)
No Show

## 2015-04-28 ENCOUNTER — Encounter: Payer: BLUE CROSS/BLUE SHIELD | Admitting: Cardiovascular Disease

## 2015-04-28 ENCOUNTER — Encounter: Payer: Self-pay | Admitting: Cardiovascular Disease

## 2015-05-03 ENCOUNTER — Telehealth: Payer: Self-pay | Admitting: Cardiovascular Disease

## 2015-05-03 ENCOUNTER — Encounter: Payer: Self-pay | Admitting: Cardiovascular Disease

## 2015-05-03 NOTE — Telephone Encounter (Signed)
Error //intended to send letter// started tel note by accident//will close

## 2015-07-20 ENCOUNTER — Other Ambulatory Visit: Payer: Self-pay | Admitting: *Deleted

## 2015-07-20 ENCOUNTER — Encounter: Payer: Self-pay | Admitting: Cardiovascular Disease

## 2015-07-20 DIAGNOSIS — I359 Nonrheumatic aortic valve disorder, unspecified: Secondary | ICD-10-CM

## 2015-07-20 NOTE — Progress Notes (Signed)
Patient ID: Jessica [name], female   DOB: 05-15-1961, 54 y.o.   MRN: [id number] 54 y.o. female first seen 2014 for preop clearance . She has a history of bicuspid AV with stenosis and regurgitation.  Some exertional dyspnea and PVC on ECG. Stopped smoking 2 weeks ago and is on Chantix. CRF also include family history with mother being a patient of Dr Dinah Beers. Needs hernia repair which is low risk surgery. Has never had her aorta looked at for aortopathy. Despite smoking and family history with dspnea and PVC has not had a previous treadmill. Denies SSCP, syncope, edema. Gets occasional palpitations.  Echo  07/2013  Study Conclusions  - Left ventricle: The cavity size was normal. Wall thickness was normal. Systolic function was normal. The estimated ejection fraction was in the range of 60% to 65%. Wall motion was normal; there were no regional wall motion abnormalities. Doppler parameters are consistent with abnormal left ventricular relaxation (grade 1 diastolic dysfunction). - Aortic valve: Bicuspid; moderately calcified leaflets. There was moderate stenosis. Mild regurgitation. Mean gradient: 38mm Hg (S). Peak gradient: 39mm Hg (S). Valve area: 1cm^2(VTI). - Aorta: Mildly dilated ascending aorta. Ascending aortic diameter: 31mm (S). - Mitral valve: No significant regurgitation. - Left atrium: The atrium was mildly dilated. - Right ventricle: The cavity size was normal. Systolic function was normal. - Tricuspid valve: Peak RV-RA gradient: 51mm Hg (S). - Pulmonary arteries: PA systolic pressure 123456 mmHg. - Systemic veins: The IVC measured 1.9 with normal respirophasic variation, suggesting RA pressure 6-10 mmHg. Impressions:  - Normal LV size and systolic function, EF 123456. Bicuspid aortic valve with mild AI, moderate AS. Normal RV size and systolic function.  Bothered by allergies.  Still raising grandkids as daughter in in Rx for heroin    Reviewed echo from today 07/21/15  EF normal moderate AR moderate AS mean gradient 27 mmHg/ peak 44 mmHg actually lower than previous  Has had episodes of dizziness and presyncope driving  Gets headache with these  Quit smoking   ROS: Denies fever, malais, weight loss, blurry vision, decreased visual acuity, cough, sputum, SOB, hemoptysis, pleuritic pain, palpitaitons, heartburn, abdominal pain, melena, lower extremity edema, claudication, or rash.  All other systems reviewed and negative  General: Affect appropriate Healthy:  appears stated age 11: normal  Bronchitic voice Neck supple with no adenopathy JVP normal no bruits no thyromegaly Lungs clear with no wheezing and good diaphragmatic motion Heart:  S1/S2  AS murmur, no rub, gallop or click PMI normal Abdomen: benighn, BS positve, no tenderness, no AAA no bruit.  No HSM or HJR Distal pulses intact with no bruits No edema Neuro non-focal Skin warm and dry No muscular weakness   Current Outpatient Prescriptions  Medication Sig Dispense Refill  . multivitamin (THERAGRAN) per tablet Take 1 tablet by mouth daily.       No current facility-administered medications for this visit.    Allergies  Penicillins  Electrocardiogram:   2014  SR rate 69 normal   Assessment and Plan Bicuspid AV:  Moderate AS/AR  Stable f/u MRI/MRA root next year PreSyncope:  MRI/MRA head to r/o aneurysm in association with bicuspid aortic vlave Smoking:  Congratulated her on quitting lungs clear no active wheezing still needs yearly CXR for 5 years   [employer]

## 2015-07-21 ENCOUNTER — Encounter: Payer: Self-pay | Admitting: Cardiovascular Disease

## 2015-07-21 ENCOUNTER — Other Ambulatory Visit: Payer: Self-pay

## 2015-07-21 ENCOUNTER — Ambulatory Visit (HOSPITAL_COMMUNITY): Payer: BLUE CROSS/BLUE SHIELD | Attending: Cardiovascular Disease

## 2015-07-21 ENCOUNTER — Ambulatory Visit (INDEPENDENT_AMBULATORY_CARE_PROVIDER_SITE_OTHER): Payer: BLUE CROSS/BLUE SHIELD | Admitting: Cardiovascular Disease

## 2015-07-21 VITALS — BP 106/80 | HR 56 | Ht 64.0 in | Wt 131.4 lb

## 2015-07-21 DIAGNOSIS — I340 Nonrheumatic mitral (valve) insufficiency: Secondary | ICD-10-CM | POA: Diagnosis not present

## 2015-07-21 DIAGNOSIS — I359 Nonrheumatic aortic valve disorder, unspecified: Secondary | ICD-10-CM | POA: Insufficient documentation

## 2015-07-21 DIAGNOSIS — I350 Nonrheumatic aortic (valve) stenosis: Secondary | ICD-10-CM

## 2015-07-21 DIAGNOSIS — R42 Dizziness and giddiness: Secondary | ICD-10-CM | POA: Diagnosis not present

## 2015-07-21 DIAGNOSIS — I352 Nonrheumatic aortic (valve) stenosis with insufficiency: Secondary | ICD-10-CM | POA: Diagnosis not present

## 2015-07-21 DIAGNOSIS — I071 Rheumatic tricuspid insufficiency: Secondary | ICD-10-CM | POA: Diagnosis not present

## 2015-07-21 DIAGNOSIS — Z01812 Encounter for preprocedural laboratory examination: Secondary | ICD-10-CM | POA: Diagnosis not present

## 2015-07-21 DIAGNOSIS — I517 Cardiomegaly: Secondary | ICD-10-CM | POA: Diagnosis not present

## 2015-07-21 DIAGNOSIS — R55 Syncope and collapse: Secondary | ICD-10-CM | POA: Diagnosis not present

## 2015-07-21 NOTE — Patient Instructions (Addendum)
Medication Instructions:  Your physician recommends that you continue on your current medications as directed. Please refer to the Current Medication list given to you today.   Labwork: TODAY BMET   Testing/Procedures:.  Your physician has requested that you have a cardiac MRI. Cardiac MRI uses a computer to create images of your heart as its beating, producing both still and moving pictures of your heart and major blood vessels. For further information please visit [email or web address] Please follow the instruction sheet given to you today for more information.  MRI  OF HEAD Follow-Up: Your physician wants you to follow-up in:   [place] will receive a reminder letter in the mail two months in advance. If you don't receive a letter, please call our office to schedule the follow-up appointment.  Any Other Special Instructions Will Be Listed Below (If Applicable).     If you need a refill on your cardiac medications before your next appointment, please call your pharmacy.

## 2015-07-21 NOTE — Addendum Note (Signed)
Addended by: Devra Dopp E on: 07/21/2015 11:44 AM   Modules accepted: Orders

## 2015-07-22 ENCOUNTER — Ambulatory Visit: Payer: BLUE CROSS/BLUE SHIELD | Admitting: Cardiovascular Disease

## 2015-07-26 ENCOUNTER — Other Ambulatory Visit: Payer: Self-pay | Admitting: *Deleted

## 2015-08-03 ENCOUNTER — Encounter: Payer: Self-pay | Admitting: Cardiovascular Disease

## 2015-08-04 ENCOUNTER — Other Ambulatory Visit: Payer: Self-pay | Admitting: *Deleted

## 2015-08-04 DIAGNOSIS — R42 Dizziness and giddiness: Secondary | ICD-10-CM

## 2015-08-09 ENCOUNTER — Ambulatory Visit (HOSPITAL_COMMUNITY)
Admission: RE | Admit: 2015-08-09 | Discharge: 2015-08-09 | Disposition: A | Discharge status: Home / Self Care | Payer: BLUE CROSS/BLUE SHIELD | Source: Ambulatory Visit | Attending: Cardiovascular Disease | Admitting: Cardiovascular Disease

## 2015-08-09 DIAGNOSIS — R42 Dizziness and giddiness: Secondary | ICD-10-CM | POA: Insufficient documentation

## 2015-08-09 DIAGNOSIS — Q231 Congenital insufficiency of aortic valve: Secondary | ICD-10-CM | POA: Insufficient documentation

## 2015-08-09 DIAGNOSIS — I352 Nonrheumatic aortic (valve) stenosis with insufficiency: Secondary | ICD-10-CM | POA: Diagnosis not present

## 2015-08-09 DIAGNOSIS — R55 Syncope and collapse: Secondary | ICD-10-CM

## 2015-08-09 DIAGNOSIS — I77810 Thoracic aortic ectasia: Secondary | ICD-10-CM | POA: Insufficient documentation

## 2015-08-09 DIAGNOSIS — I350 Nonrheumatic aortic (valve) stenosis: Secondary | ICD-10-CM | POA: Diagnosis not present

## 2015-08-09 MED ORDER — GADOBENATE DIMEGLUMINE 529 MG/ML IV SOLN
20.0000 mL | Freq: Once | INTRAVENOUS | Status: AC | PRN
  Administered 2015-08-09: 20 mL via INTRAVENOUS

## 2015-10-25 ENCOUNTER — Ambulatory Visit: Payer: BLUE CROSS/BLUE SHIELD | Admitting: Neurology

## 2015-10-25 ENCOUNTER — Telehealth: Payer: Self-pay

## 2015-10-25 NOTE — Telephone Encounter (Signed)
[facility] same day of appt

## 2016-06-11 DIAGNOSIS — M7042 Prepatellar bursitis, left knee: Secondary | ICD-10-CM | POA: Diagnosis not present

## 2016-06-15 DIAGNOSIS — M7042 Prepatellar bursitis, left knee: Secondary | ICD-10-CM | POA: Diagnosis not present

## 2016-07-02 DIAGNOSIS — M7042 Prepatellar bursitis, left knee: Secondary | ICD-10-CM | POA: Diagnosis not present

## 2016-08-06 DIAGNOSIS — M7052 Other bursitis of knee, left knee: Secondary | ICD-10-CM | POA: Diagnosis not present

## 2016-08-06 DIAGNOSIS — M25562 Pain in left knee: Secondary | ICD-10-CM | POA: Diagnosis not present

## 2016-11-20 ENCOUNTER — Encounter: Payer: Self-pay | Admitting: Gastroenterology

## 2016-11-20 DIAGNOSIS — L918 Other hypertrophic disorders of the skin: Secondary | ICD-10-CM | POA: Diagnosis not present

## 2016-11-20 DIAGNOSIS — Z0000 Encounter for general adult medical examination without abnormal findings: Secondary | ICD-10-CM | POA: Diagnosis not present

## 2016-11-23 DIAGNOSIS — N390 Urinary tract infection, site not specified: Secondary | ICD-10-CM | POA: Diagnosis not present

## 2016-11-23 DIAGNOSIS — Z0000 Encounter for general adult medical examination without abnormal findings: Secondary | ICD-10-CM | POA: Diagnosis not present

## 2016-11-30 ENCOUNTER — Encounter (INDEPENDENT_AMBULATORY_CARE_PROVIDER_SITE_OTHER): Payer: Self-pay

## 2016-11-30 ENCOUNTER — Ambulatory Visit (INDEPENDENT_AMBULATORY_CARE_PROVIDER_SITE_OTHER): Payer: BLUE CROSS/BLUE SHIELD | Admitting: Cardiovascular Disease

## 2016-11-30 ENCOUNTER — Encounter: Payer: Self-pay | Admitting: Cardiovascular Disease

## 2016-11-30 VITALS — BP 120/76 | HR 70 | Ht 64.0 in | Wt 132.8 lb

## 2016-11-30 DIAGNOSIS — I359 Nonrheumatic aortic valve disorder, unspecified: Secondary | ICD-10-CM | POA: Diagnosis not present

## 2016-11-30 DIAGNOSIS — I350 Nonrheumatic aortic (valve) stenosis: Secondary | ICD-10-CM

## 2016-11-30 NOTE — Patient Instructions (Addendum)
Medication Instructions:  Your physician recommends that you continue on your current medications as directed. Please refer to the Current Medication list given to you today.  Labwork: NONE  Testing/Procedures: Your physician has requested that you have an echocardiogram. Echocardiography is a painless test that uses sound waves to create images of your heart. It provides your doctor with information about the size and shape of your heart and how well your heart's chambers and valves are working. This procedure takes approximately one hour. There are no restrictions for this procedure.  Follow-Up: Your physician wants you to follow-up in: November with Dr. Johnsie Cancel. You will receive a reminder letter in the mail two months in advance. If you don't receive a letter, please call our office to schedule the follow-up appointment.   If you need a refill on your cardiac medications before your next appointment, please call your pharmacy.

## 2016-11-30 NOTE — Progress Notes (Signed)
Patient ID: Jessica [name], female   DOB: 12-Jan-1961, 56 y.o.   MRN: [id number]   56 y.o. female first seen 2014 for preop clearance . She has a history of bicuspid AV with stenosis and regurgitation.  Some exertional dyspnea and PVC on ECG. Stopped smoking 2 years  ago   CRF also include family history with mother being a patient of Dr Dinah Beers. History of benign PVC   Echo  07/21/15  Reviewed moderate AS and mild / moderate AR  Study Conclusions  - Left ventricle: The cavity size was normal. There was mild   concentric hypertrophy. Systolic function was normal. The   estimated ejection fraction was in the range of 60% to 65%. Wall   motion was normal; there were no regional wall motion   abnormalities. Left ventricular diastolic function parameters   were normal. - Aortic valve: Bicuspid; normal thickness, severely calcified   leaflets. Valve mobility was restricted. Transvalvular velocity   was increased. There was moderate stenosis. There was mild to   moderate regurgitation. Peak velocity (S): 332 cm/s. Mean   gradient (S): 26 mm Hg. Valve area (VTI): 0.99 cm^2. Valve area   (Vmax): 0.96 cm^2. Valve area (Vmean): 0.86 cm^2. - Mitral valve: There was mild regurgitation. - Left atrium: The atrium was mildly dilated. - Right ventricle: The cavity size was normal. Wall thickness was   normal. Systolic function was normal. - Tricuspid valve: There was mild regurgitation. - Inferior vena cava: The vessel was small, appearing collapsed,   consistent with low central venous pressure. The respirophasic   diameter changes were in the normal range (= 50%), consistent   with normal central venous pressure.   Mean gradient 27 mmHg/ peak 44 mmHg   Quit smoking  Daughter is out of heroin rehab and has custody of her 3 kids again   ROS: Denies fever, malais, weight loss, blurry vision, decreased visual acuity, cough, sputum, SOB, hemoptysis, pleuritic pain, palpitaitons, heartburn,  abdominal pain, melena, lower extremity edema, claudication, or rash.  All other systems reviewed and negative  General: Affect appropriate Healthy:  appears stated age 39: normal  Bronchitic voice Neck supple with no adenopathy JVP normal no bruits no thyromegaly Lungs clear with no wheezing and good diaphragmatic motion Heart:  S1/S2  AS murmur, no rub, gallop or click PMI normal Abdomen: benighn, BS positve, no tenderness, no AAA no bruit.  No HSM or HJR Distal pulses intact with no bruits No edema Neuro non-focal Skin warm and dry No muscular weakness   Current Outpatient Prescriptions  Medication Sig Dispense Refill  . multivitamin (THERAGRAN) per tablet Take 1 tablet by mouth daily.      . sertraline (ZOLOFT) 100 MG tablet Take 100 mg by mouth daily.     No current facility-administered medications for this visit.     Allergies  Penicillins  Electrocardiogram:   2014  SR rate 69 normal   11/30/16 SR rate 67 normal   Assessment and Plan Bicuspid AV:  Moderate AS/AR   f/u echo in November will repeat MRA/MRI To resize aortic root which was 4.4 cm in 2016  Smoking:  Congratulated her on quitting lungs clear no active wheezing still needs yearly CXR for 5 years   [employer]

## 2016-12-04 DIAGNOSIS — J45909 Unspecified asthma, uncomplicated: Secondary | ICD-10-CM | POA: Diagnosis not present

## 2016-12-14 ENCOUNTER — Ambulatory Visit (HOSPITAL_COMMUNITY): Payer: BLUE CROSS/BLUE SHIELD | Attending: Cardiovascular Disease

## 2016-12-14 ENCOUNTER — Other Ambulatory Visit: Payer: Self-pay

## 2016-12-14 DIAGNOSIS — I359 Nonrheumatic aortic valve disorder, unspecified: Secondary | ICD-10-CM | POA: Diagnosis not present

## 2016-12-14 DIAGNOSIS — Q231 Congenital insufficiency of aortic valve: Secondary | ICD-10-CM | POA: Insufficient documentation

## 2016-12-14 DIAGNOSIS — I350 Nonrheumatic aortic (valve) stenosis: Secondary | ICD-10-CM

## 2016-12-18 ENCOUNTER — Encounter: Payer: Self-pay | Admitting: Cardiovascular Disease

## 2016-12-18 ENCOUNTER — Ambulatory Visit (INDEPENDENT_AMBULATORY_CARE_PROVIDER_SITE_OTHER): Payer: BLUE CROSS/BLUE SHIELD | Admitting: Cardiovascular Disease

## 2016-12-18 VITALS — BP 118/80 | HR 73 | Ht 64.0 in | Wt 131.4 lb

## 2016-12-18 DIAGNOSIS — Z01812 Encounter for preprocedural laboratory examination: Secondary | ICD-10-CM | POA: Diagnosis not present

## 2016-12-18 DIAGNOSIS — I350 Nonrheumatic aortic (valve) stenosis: Secondary | ICD-10-CM | POA: Diagnosis not present

## 2016-12-18 NOTE — Patient Instructions (Addendum)
Medication Instructions:  Your physician recommends that you continue on your current medications as directed. Please refer to the Current Medication list given to you today.  Labwork: Your physician recommends that you have lab work today- BMET, CBC, and PT/INR  Testing/Procedures: Your physician has requested that you have a cardiac catheterization. Cardiac catheterization is used to diagnose and/or treat various heart conditions. Doctors may recommend this procedure for a number of different reasons. The most common reason is to evaluate chest pain. Chest pain can be a symptom of coronary artery disease (CAD), and cardiac catheterization can show whether plaque is narrowing or blocking your heart's arteries. This procedure is also used to evaluate the valves, as well as measure the blood flow and oxygen levels in different parts of your heart. For further information please visit HugeFiesta.tn. Please follow instruction sheet, as given.  Follow-Up: Your physician wants you to follow-up in: 3 weeks with Dr. Johnsie Cancel or PA/NP after cardiac catheterization.    If you need a refill on your cardiac medications before your next appointment, please call your pharmacy.    [facility] [address] [place] [address] Dept: [phone] Loc: [phone]  Jessica [name]  12/18/2016  You are scheduled for a Cardiac Catheterization on Wednesday, April 25 with Dr. Sherren Mocha.  1. Please arrive at the [facility] (Main Entrance A) at [facility]: [address] [place], [place] [address] at 11:00 AM (two hours before your procedure to ensure your preparation). Free valet parking service is available.   Special note: Every effort is made to have your procedure done on time. Please understand that emergencies sometimes delay scheduled procedures.  2. Diet: Do not eat or drink  anything after midnight prior to your procedure except sips of water to take medications.  3. Labs: None needed.  4. Medication instructions in preparation for your procedure:  On the morning of your procedure you can take all your morning medicines NOT listed above.  You may use sips of water.  5. Plan for one night stay--bring personal belongings. 6. Bring a current list of your medications and current insurance cards. 7. You MUST have a responsible person to drive you home. 8. Someone MUST be with you the first 24 hours after you arrive home or your discharge will be delayed. 9. Please wear clothes that are easy to get on and off and wear slip-on shoes.  Thank you for allowing [place] to care for you!   -- [facility]

## 2016-12-18 NOTE — Progress Notes (Signed)
Patient ID: Jessica [name], female   DOB: [birth year], 56 y.o.   MRN: [id number]   56 y.o. female first seen 2014 for preop clearance . She has a history of bicuspid AV with stenosis and regurgitation.  Some exertional dyspnea and PVC on ECG. Stopped smoking 3 years  ago   CRF also include family history with mother being a patient of Dr Dinah Beers. History of benign PVC had f/u echo this week showed progressive disease    Echo 12/14/16   .Study Conclusions  - Left ventricle: The cavity size was normal. Systolic function was   normal. The estimated ejection fraction was in the range of 60%   to 65%. Wall motion was normal; there were no regional wall   motion abnormalities. Doppler parameters are consistent with   abnormal left ventricular relaxation (grade 1 diastolic   dysfunction). - Aortic valve: Bicuspid; severely thickened, severely calcified   leaflets; fusion of the right-left coronary commissure. Valve   mobility was restricted. There was severe stenosis. There was   mild regurgitation. Peak velocity (S): 389 cm/s. Mean gradient   (S): 40 mm Hg. - Aorta: Ascending aortic diameter: 43 mm (S). - Ascending aorta: The ascending aorta was mildly dilated. - Mitral valve: Transvalvular velocity was within the normal range.   There was no evidence for stenosis. There was trivial   regurgitation. - Right ventricle: The cavity size was normal. Wall thickness was   normal. Systolic function was normal. - Atrial septum: No defect or patent foramen ovale was identified. - Tricuspid valve: There was mild-moderate regurgitation. - Pulmonary arteries: Systolic pressure was within the normal   range. PA peak pressure: 30 mm Hg (S).   07/21/15 Mean gradient 27 mmHg/ peak 44 mmHg 12/14/16   Mean gradient 40 mmHg peak 61 mmHg   Quit smoking  Daughter is out of heroin rehab and has custody of her 3 kids again   She is still active Has fatigue but denies dyspnea. Pre syncope or chest pain.   Discussed the progression of her valve disease and indication for surgery in the near Future with a mean gradient of 40 mmHg that has progressed. Discussed starting with  Left and right cath to define coronary anatomy and possible cardiac CT to size root.  She has a lot of things planned for the spring and summer. Told her she should probably Consider valve replacement this year.   ROS: Denies fever, malais, weight loss, blurry vision, decreased visual acuity, cough, sputum, SOB, hemoptysis, pleuritic pain, palpitaitons, heartburn, abdominal pain, melena, lower extremity edema, claudication, or rash.  All other systems reviewed and negative  General: Affect appropriate Healthy:  appears stated age 34: normal  Bronchitic voice Neck supple with no adenopathy JVP normal no bruits no thyromegaly Lungs clear with no wheezing and good diaphragmatic motion Heart:  S1/S2 is preserved AS murmur  no rub, gallop or click PMI normal Abdomen: benighn, BS positve, no tenderness, no AAA no bruit.  No HSM or HJR Distal pulses intact with no bruits No edema Neuro non-focal Skin warm and dry No muscular weakness   Current Outpatient Prescriptions  Medication Sig Dispense Refill  . multivitamin (THERAGRAN) per tablet Take 1 tablet by mouth daily.      . sertraline (ZOLOFT) 100 MG tablet Take 100 mg by mouth daily.     No current facility-administered medications for this visit.     Allergies  Penicillins  Electrocardiogram:   2014  SR rate 69 normal  11/30/16 SR rate 67 normal   Assessment and Plan Bicuspid AV:  With progression of mean gradient from 27 to 40 mmHg and peak from 44 to 61 mmHg EF preserved. Right and left cath scheduled with Dr Burt Knack next week. Labs today Risks including stroke death contrast reaction and need for emergency surgery ( her father died during a heart cath at a hospital That did not have surgical back up)   Orders written Will then set up to see Dr Cyndia Bent  post cath She will likely need a mechanical valve given her young age  Aneurysm:  Do not want to give her contrast twice in a week. Suspect we will do cardiac CT after she Sees Dr Cyndia Bent to size root which was 4.3 cm by echo   Smoking:  Congratulated her on quitting lungs clear no active wheezing still needs yearly CXR for 5 years   Depression.  Continue zoloft doing better since daughter has recovered from her addiction   Jenkins Rouge

## 2016-12-19 LAB — CBC WITH DIFFERENTIAL/PLATELET
BASOS ABS: 0 10*3/uL (ref 0.0–0.2)
Basos: 1 %
EOS (ABSOLUTE): 0 10*3/uL (ref 0.0–0.4)
Eos: 1 %
Hematocrit: 39.8 % (ref 34.0–46.6)
Hemoglobin: 13.6 g/dL (ref 11.1–15.9)
IMMATURE GRANS (ABS): 0 10*3/uL (ref 0.0–0.1)
IMMATURE GRANULOCYTES: 0 %
Lymphocytes Absolute: 1.5 10*3/uL (ref 0.7–3.1)
Lymphs: 35 %
MCH: 32.6 pg (ref 26.6–33.0)
MCHC: 34.2 g/dL (ref 31.5–35.7)
MCV: 95 fL (ref 79–97)
Monocytes Absolute: 0.5 10*3/uL (ref 0.1–0.9)
Monocytes: 11 %
NEUTROS PCT: 52 %
Neutrophils Absolute: 2.3 10*3/uL (ref 1.4–7.0)
PLATELETS: 332 10*3/uL (ref 150–379)
RBC: 4.17 x10E6/uL (ref 3.77–5.28)
RDW: 13.2 % (ref 12.3–15.4)
WBC: 4.4 10*3/uL (ref 3.4–10.8)

## 2016-12-19 LAB — PROTIME-INR
INR: 1 (ref 0.8–1.2)
Prothrombin Time: 10.6 s (ref 9.1–12.0)

## 2016-12-19 LAB — BASIC METABOLIC PANEL
BUN / CREAT RATIO: 16 (ref 9–23)
BUN: 14 mg/dL (ref 6–24)
CALCIUM: 9.6 mg/dL (ref 8.7–10.2)
CO2: 23 mmol/L (ref 18–29)
Chloride: 101 mmol/L (ref 96–106)
Creatinine, Ser: 0.88 mg/dL (ref 0.57–1.00)
GFR calc Af Amer: 86 mL/min/{1.73_m2} (ref >59)
GFR calc non Af Amer: 75 mL/min/{1.73_m2} (ref >59)
GLUCOSE: 92 mg/dL (ref 65–99)
Potassium: 4.8 mmol/L (ref 3.5–5.2)
Sodium: 141 mmol/L (ref 134–144)

## 2016-12-21 ENCOUNTER — Other Ambulatory Visit: Payer: Self-pay | Admitting: Cardiovascular Disease

## 2016-12-21 ENCOUNTER — Encounter: Payer: BLUE CROSS/BLUE SHIELD | Admitting: Surgery

## 2016-12-21 DIAGNOSIS — I350 Nonrheumatic aortic (valve) stenosis: Secondary | ICD-10-CM

## 2016-12-24 ENCOUNTER — Ambulatory Visit: Payer: BLUE CROSS/BLUE SHIELD | Admitting: Gastroenterology

## 2016-12-26 ENCOUNTER — Telehealth: Payer: Self-pay

## 2016-12-26 ENCOUNTER — Ambulatory Visit (HOSPITAL_COMMUNITY)
Admission: RE | Admit: 2016-12-26 | Discharge: 2016-12-26 | Disposition: A | Discharge status: Home / Self Care | Payer: BLUE CROSS/BLUE SHIELD | Source: Ambulatory Visit | Attending: Cardiovascular Disease | Admitting: Cardiovascular Disease

## 2016-12-26 ENCOUNTER — Encounter (HOSPITAL_COMMUNITY)
Admission: RE | Disposition: A | Discharge status: Home / Self Care | Payer: Self-pay | Source: Ambulatory Visit | Attending: Cardiovascular Disease

## 2016-12-26 DIAGNOSIS — I352 Nonrheumatic aortic (valve) stenosis with insufficiency: Secondary | ICD-10-CM | POA: Diagnosis present

## 2016-12-26 DIAGNOSIS — F329 Major depressive disorder, single episode, unspecified: Secondary | ICD-10-CM | POA: Diagnosis not present

## 2016-12-26 DIAGNOSIS — I350 Nonrheumatic aortic (valve) stenosis: Secondary | ICD-10-CM

## 2016-12-26 DIAGNOSIS — Q231 Congenital insufficiency of aortic valve: Secondary | ICD-10-CM | POA: Diagnosis not present

## 2016-12-26 DIAGNOSIS — Z880 Allergy status to penicillin: Secondary | ICD-10-CM | POA: Diagnosis not present

## 2016-12-26 DIAGNOSIS — Z87891 Personal history of nicotine dependence: Secondary | ICD-10-CM | POA: Diagnosis not present

## 2016-12-26 DIAGNOSIS — I359 Nonrheumatic aortic valve disorder, unspecified: Secondary | ICD-10-CM | POA: Diagnosis present

## 2016-12-26 LAB — POCT I-STAT 3, VENOUS BLOOD GAS (G3P V)
ACID-BASE DEFICIT: 3 mmol/L — AB (ref 0.0–2.0)
Bicarbonate: 22.2 mmol/L (ref 20.0–28.0)
O2 Saturation: 71 %
PH VEN: 7.348 (ref 7.250–7.430)
TCO2: 23 mmol/L (ref 0–100)
pCO2, Ven: 40.5 mmHg — ABNORMAL LOW (ref 44.0–60.0)
pO2, Ven: 39 mmHg (ref 32.0–45.0)

## 2016-12-26 LAB — POCT I-STAT 3, ART BLOOD GAS (G3+)
Acid-base deficit: 2 mmol/L (ref 0.0–2.0)
Bicarbonate: 23.1 mmol/L (ref 20.0–28.0)
O2 Saturation: 98 %
PCO2 ART: 41.4 mmHg (ref 32.0–48.0)
PH ART: 7.355 (ref 7.350–7.450)
TCO2: 24 mmol/L (ref 0–100)
pO2, Arterial: 110 mmHg — ABNORMAL HIGH (ref 83.0–108.0)

## 2016-12-26 LAB — POCT ACTIVATED CLOTTING TIME: ACTIVATED CLOTTING TIME: 164 s

## 2016-12-26 SURGERY — RIGHT HEART CATH AND CORONARY ANGIOGRAPHY
Anesthesia: LOCAL

## 2016-12-26 MED ORDER — ASPIRIN 81 MG PO CHEW
81.0000 mg | CHEWABLE_TABLET | ORAL | Status: AC
  Administered 2016-12-26: 81 mg via ORAL

## 2016-12-26 MED ORDER — FENTANYL CITRATE (PF) 100 MCG/2ML IJ SOLN
INTRAMUSCULAR | Status: DC | PRN
  Administered 2016-12-26: 50 ug via INTRAVENOUS
  Administered 2016-12-26 (×2): 25 ug via INTRAVENOUS

## 2016-12-26 MED ORDER — SODIUM CHLORIDE 0.9% FLUSH: 3.0000 mL | Freq: Two times a day (BID) | INTRAVENOUS | Status: DC

## 2016-12-26 MED ORDER — IOPAMIDOL (ISOVUE-370) INJECTION 76%
INTRAVENOUS | Status: AC
  Filled 2016-12-26: qty 100

## 2016-12-26 MED ORDER — NITROGLYCERIN 1 MG/10 ML FOR IR/CATH LAB
INTRA_ARTERIAL | Status: DC | PRN
  Administered 2016-12-26: 200 ug via INTRA_ARTERIAL
  Administered 2016-12-26: 300 ug via INTRA_ARTERIAL
  Administered 2016-12-26: 200 ug via INTRA_ARTERIAL

## 2016-12-26 MED ORDER — SODIUM CHLORIDE 0.9% FLUSH: 3.0000 mL | INTRAVENOUS | Status: DC | PRN

## 2016-12-26 MED ORDER — SODIUM CHLORIDE 0.9 % WEIGHT BASED INFUSION: 1.0000 mL/kg/h | INTRAVENOUS | Status: AC

## 2016-12-26 MED ORDER — MIDAZOLAM HCL 2 MG/2ML IJ SOLN
INTRAMUSCULAR | Status: AC
  Filled 2016-12-26: qty 2

## 2016-12-26 MED ORDER — LIDOCAINE HCL (PF) 1 % IJ SOLN
INTRAMUSCULAR | Status: AC
  Filled 2016-12-26: qty 30

## 2016-12-26 MED ORDER — SODIUM CHLORIDE 0.9 % WEIGHT BASED INFUSION: 1.0000 mL/kg/h | INTRAVENOUS | Status: DC

## 2016-12-26 MED ORDER — VERAPAMIL HCL 2.5 MG/ML IV SOLN
INTRAVENOUS | Status: AC
  Filled 2016-12-26: qty 2

## 2016-12-26 MED ORDER — HEPARIN SODIUM (PORCINE) 1000 UNIT/ML IJ SOLN
INTRAMUSCULAR | Status: DC | PRN
  Administered 2016-12-26: 3000 [IU] via INTRAVENOUS

## 2016-12-26 MED ORDER — MIDAZOLAM HCL 2 MG/2ML IJ SOLN
INTRAMUSCULAR | Status: DC | PRN
  Administered 2016-12-26 (×3): 2 mg via INTRAVENOUS

## 2016-12-26 MED ORDER — SODIUM CHLORIDE 0.9 % IV SOLN
250.0000 mL | INTRAVENOUS | Status: DC | PRN
Start: 2016-12-26 — End: 2016-12-26

## 2016-12-26 MED ORDER — LIDOCAINE HCL (PF) 1 % IJ SOLN
INTRAMUSCULAR | Status: DC | PRN
  Administered 2016-12-26: 2 mL
  Administered 2016-12-26: 20 mL via SUBCUTANEOUS
  Administered 2016-12-26: 2 mL

## 2016-12-26 MED ORDER — SODIUM CHLORIDE 0.9 % WEIGHT BASED INFUSION
3.0000 mL/kg/h | INTRAVENOUS | Status: DC
  Administered 2016-12-26: 3 mL/kg/h via INTRAVENOUS

## 2016-12-26 MED ORDER — ACETAMINOPHEN 325 MG PO TABS: 650.0000 mg | ORAL_TABLET | ORAL | Status: DC | PRN

## 2016-12-26 MED ORDER — HEPARIN (PORCINE) IN NACL 2-0.9 UNIT/ML-% IJ SOLN
INTRAMUSCULAR | Status: DC | PRN
  Administered 2016-12-26 (×2): via INTRA_ARTERIAL

## 2016-12-26 MED ORDER — FENTANYL CITRATE (PF) 100 MCG/2ML IJ SOLN
INTRAMUSCULAR | Status: AC
  Filled 2016-12-26: qty 2

## 2016-12-26 MED ORDER — IOPAMIDOL (ISOVUE-370) INJECTION 76%
INTRAVENOUS | Status: DC | PRN
  Administered 2016-12-26: 70 mL via INTRA_ARTERIAL

## 2016-12-26 MED ORDER — ONDANSETRON HCL 4 MG/2ML IJ SOLN
4.0000 mg | Freq: Four times a day (QID) | INTRAMUSCULAR | Status: DC | PRN
Start: 2016-12-26 — End: 2016-12-26

## 2016-12-26 MED ORDER — SODIUM CHLORIDE 0.9 % IV SOLN: 250.0000 mL | INTRAVENOUS | Status: DC | PRN

## 2016-12-26 MED ORDER — HEPARIN SODIUM (PORCINE) 1000 UNIT/ML IJ SOLN
INTRAMUSCULAR | Status: AC
  Filled 2016-12-26: qty 1

## 2016-12-26 MED ORDER — HEPARIN (PORCINE) IN NACL 2-0.9 UNIT/ML-% IJ SOLN
INTRAMUSCULAR | Status: DC | PRN
  Administered 2016-12-26: 1000 mL

## 2016-12-26 MED ORDER — NITROGLYCERIN 1 MG/10 ML FOR IR/CATH LAB
INTRA_ARTERIAL | Status: AC
  Filled 2016-12-26: qty 10

## 2016-12-26 MED ORDER — ASPIRIN 81 MG PO CHEW
CHEWABLE_TABLET | ORAL | Status: AC
  Filled 2016-12-26: qty 1

## 2016-12-26 MED ORDER — HEPARIN (PORCINE) IN NACL 2-0.9 UNIT/ML-% IJ SOLN
INTRAMUSCULAR | Status: AC
  Filled 2016-12-26: qty 1000

## 2016-12-26 MED ORDER — SODIUM CHLORIDE 0.9% FLUSH
3.0000 mL | Freq: Two times a day (BID) | INTRAVENOUS | Status: DC
Start: 2016-12-26 — End: 2016-12-26

## 2016-12-26 SURGICAL SUPPLY — 15 items
CATH 5FR JL3.5 JR4 ANG PIG MP (CATHETERS) ×2 IMPLANT
CATH BALLN WEDGE 5F 110CM (CATHETERS) ×2 IMPLANT
CATH INFINITI MULTIPACK ANG 4F (CATHETERS) ×2 IMPLANT
DEVICE RAD COMP TR BAND LRG (VASCULAR PRODUCTS) ×2 IMPLANT
GLIDESHEATH SLEND SS 6F .021 (SHEATH) ×2 IMPLANT
GUIDEWIRE INQWIRE 1.5J.035X260 (WIRE) ×1 IMPLANT
INQWIRE 1.5J .035X260CM (WIRE) ×2
KIT HEART LEFT (KITS) ×2 IMPLANT
PACK CARDIAC CATHETERIZATION (CUSTOM PROCEDURE TRAY) ×2 IMPLANT
SHEATH GLIDE SLENDER 4/5FR (SHEATH) ×2 IMPLANT
SHEATH PINNACLE 4F 10CM (SHEATH) ×2 IMPLANT
SYR MEDRAD MARK V 150ML (SYRINGE) ×2 IMPLANT
TRANSDUCER W/STOPCOCK (MISCELLANEOUS) ×2 IMPLANT
TUBING CIL FLEX 10 FLL-RA (TUBING) ×2 IMPLANT
WIRE HI TORQ VERSACORE J 260CM (WIRE) ×2 IMPLANT

## 2016-12-26 NOTE — Telephone Encounter (Signed)
-----   Message from Josue Hector, MD sent at 12/26/2016  3:33 PM EDT ----- Had cath today needs to see [facility] for AVR schedule cardiac CTA for size her aortic root and look at her valve next week or two weeks from now

## 2016-12-26 NOTE — Interval H&P Note (Signed)
History and Physical Interval Note:  12/26/2016 2:07 PM  Jessica [name]  has presented today for surgery, with the diagnosis of aortic stenosis  The various methods of treatment have been discussed with the patient and family. After consideration of risks, benefits and other options for treatment, the patient has consented to  Procedure(s): Right/Left Heart Cath and Coronary Angiography (N/A) as a surgical intervention .  The patient's history has been reviewed, patient examined, no change in status, stable for surgery.  I have reviewed the patient's chart and labs.  Questions were answered to the patient's satisfaction.     Sherren Mocha

## 2016-12-26 NOTE — Discharge Instructions (Signed)
Radial Site Care Refer to this sheet in the next few weeks. These instructions provide you with information about caring for yourself after your procedure. Your health care provider may also give you more specific instructions. Your treatment has been planned according to current medical practices, but problems sometimes occur. Call your health care provider if you have any problems or questions after your procedure. What can I expect after the procedure? After your procedure, it is typical to have the following:  Bruising at the radial site that usually fades within 1-2 weeks.  Blood collecting in the tissue (hematoma) that may be painful to the touch. It should usually decrease in size and tenderness within 1-2 weeks. Follow these instructions at home:  Take medicines only as directed by your health care provider.  You may shower 24-48 hours after the procedure or as directed by your health care provider. Remove the bandage (dressing) and gently wash the site with plain soap and water. Pat the area dry with a clean towel. Do not rub the site, because this may cause bleeding.  Do not take baths, swim, or use a hot tub until your health care provider approves.  Check your insertion site every day for redness, swelling, or drainage.  Do not apply powder or lotion to the site.  Do not flex or bend the affected arm for 24 hours or as directed by your health care provider.  Do not push or pull heavy objects with the affected arm for 24 hours or as directed by your health care provider.  Do not lift over 10 lb (4.5 kg) for 5 days after your procedure or as directed by your health care provider.  Ask your health care provider when it is okay to:  Return to work or school.  Resume usual physical activities or sports.  Resume sexual activity.  Do not drive home if you are discharged the same day as the procedure. Have someone else drive you.  You may drive 24 hours after the procedure  unless otherwise instructed by your health care provider.  Do not operate machinery or power tools for 24 hours after the procedure.  If your procedure was done as an outpatient procedure, which means that you went home the same day as your procedure, a responsible adult should be with you for the first 24 hours after you arrive home.  Keep all follow-up visits as directed by your health care provider. This is important. Contact a health care provider if:  You have a fever.  You have chills.  You have increased bleeding from the radial site. Hold pressure on the site. Get help right away if:  You have unusual pain at the radial site.  You have redness, warmth, or swelling at the radial site.  You have drainage (other than a small amount of blood on the dressing) from the radial site.  The radial site is bleeding, and the bleeding does not stop after 30 minutes of holding steady pressure on the site.  Your arm or hand becomes pale, cool, tingly, or numb. This information is not intended to replace advice given to you by your health care provider. Make sure you discuss any questions you have with your health care provider. Document Released: 09/22/2010 Document Revised: 01/26/2016 Document Reviewed: 03/08/2014 Elsevier Interactive Patient Education  2017 [place] After This sheet gives you information about how to care for yourself after your procedure. Your health care provider may also give  you more specific instructions. If you have problems or questions, contact your health care provider. What can I expect after the procedure? After the procedure, it is common to have bruising and tenderness at the catheter insertion area. Follow these instructions at home: Insertion site care   Follow instructions from your health care provider about how to take care of your insertion site. Make sure you:  Wash your hands with soap and water before you change your  bandage (dressing). If soap and water are not available, use hand sanitizer.  Change your dressing as told by your health care provider.  Leave stitches (sutures), skin glue, or adhesive strips in place. These skin closures may need to stay in place for 2 weeks or longer. If adhesive strip edges start to loosen and curl up, you may trim the loose edges. Do not remove adhesive strips completely unless your health care provider tells you to do that.  Do not take baths, swim, or use a hot tub until your health care provider approves.  You may shower 24-48 hours after the procedure or as told by your health care provider.  Gently wash the site with plain soap and water.  Pat the area dry with a clean towel.  Do not rub the site. This may cause bleeding.  Do not apply powder or lotion to the site. Keep the site clean and dry.  Check your insertion site every day for signs of infection. Check for:  Redness, swelling, or pain.  Fluid or blood.  Warmth.  Pus or a bad smell. Activity   Rest as told by your health care provider, usually for 1-2 days.  Do not lift anything that is heavier than 10 lbs. (4.5 kg) or as told by your health care provider.  Do not drive for 24 hours if you were given a medicine to help you relax (sedative).  Do not drive or use heavy machinery while taking prescription pain medicine. General instructions   Return to your normal activities as told by your health care provider, usually in about a week. Ask your health care provider what activities are safe for you.  If the catheter site starts bleeding, lie flat and put pressure on the site. If the bleeding does not stop, get help right away. This is a medical emergency.  Drink enough fluid to keep your urine clear or pale yellow. This helps flush the contrast dye from your body.  Take over-the-counter and prescription medicines only as told by your health care provider.  Keep all follow-up visits as told  by your health care provider. This is important. Contact a health care provider if:  You have a fever or chills.  You have redness, swelling, or pain around your insertion site.  You have fluid or blood coming from your insertion site.  The insertion site feels warm to the touch.  You have pus or a bad smell coming from your insertion site.  You have bruising around the insertion site.  You notice blood collecting in the tissue around the catheter site (hematoma). The hematoma may be painful to the touch. Get help right away if:  You have severe pain at the catheter insertion area.  The catheter insertion area swells very fast.  The catheter insertion area is bleeding, and the bleeding does not stop when you hold steady pressure on the area.  The area near or just beyond the catheter insertion site becomes pale, cool, tingly, or numb. These symptoms may represent  a serious problem that is an emergency. Do not wait to see if the symptoms will go away. Get medical help right away. Call your local emergency services (911 in the [place] Do not drive yourself to the hospital. Summary  After the procedure, it is common to have bruising and tenderness at the catheter insertion area.  After the procedure, it is important to rest and drink plenty of fluids.  Do not take baths, swim, or use a hot tub until your health care provider says it is okay to do so. You may shower 24-48 hours after the procedure or as told by your health care provider.  If the catheter site starts bleeding, lie flat and put pressure on the site. If the bleeding does not stop, get help right away. This is a medical emergency. This information is not intended to replace advice given to you by your health care provider. Make sure you discuss any questions you have with your health care provider. Document Released: 03/08/2005 Document Revised: 07/25/2016 Document Reviewed: 07/25/2016 Elsevier Interactive Patient  Education  2017 [employer].

## 2016-12-26 NOTE — Progress Notes (Signed)
Site area: RFA Site Prior to Removal:  Level 0 Pressure Applied For:20 min Manual:   yes Patient Status During Pull:stable   Post Pull Site:  Level 0 Post Pull Instructions Given: yes  Post Pull Pulses Present: palpable Dressing Applied: tegaderm  Bedrest begins @ 1600 till 1800 Comments:

## 2016-12-26 NOTE — H&P (View-Only) (Signed)
Patient ID: Jessica [name], female   DOB: 06/26/1961, 55 y.o.   MRN: [id number]   56 y.o. female first seen 2014 for preop clearance . She has a history of bicuspid AV with stenosis and regurgitation.  Some exertional dyspnea and PVC on ECG. Stopped smoking 3 years  ago   CRF also include family history with mother being a patient of Dr Dinah Beers. History of benign PVC had f/u echo this week showed progressive disease    Echo 12/14/16   .Study Conclusions  - Left ventricle: The cavity size was normal. Systolic function was   normal. The estimated ejection fraction was in the range of 60%   to 65%. Wall motion was normal; there were no regional wall   motion abnormalities. Doppler parameters are consistent with   abnormal left ventricular relaxation (grade 1 diastolic   dysfunction). - Aortic valve: Bicuspid; severely thickened, severely calcified   leaflets; fusion of the right-left coronary commissure. Valve   mobility was restricted. There was severe stenosis. There was   mild regurgitation. Peak velocity (S): 389 cm/s. Mean gradient   (S): 40 mm Hg. - Aorta: Ascending aortic diameter: 43 mm (S). - Ascending aorta: The ascending aorta was mildly dilated. - Mitral valve: Transvalvular velocity was within the normal range.   There was no evidence for stenosis. There was trivial   regurgitation. - Right ventricle: The cavity size was normal. Wall thickness was   normal. Systolic function was normal. - Atrial septum: No defect or patent foramen ovale was identified. - Tricuspid valve: There was mild-moderate regurgitation. - Pulmonary arteries: Systolic pressure was within the normal   range. PA peak pressure: 30 mm Hg (S).   07/21/15 Mean gradient 27 mmHg/ peak 44 mmHg 12/14/16   Mean gradient 40 mmHg peak 61 mmHg   Quit smoking  Daughter is out of heroin rehab and has custody of her 3 kids again   She is still active Has fatigue but denies dyspnea. Pre syncope or chest pain.   Discussed the progression of her valve disease and indication for surgery in the near Future with a mean gradient of 40 mmHg that has progressed. Discussed starting with  Left and right cath to define coronary anatomy and possible cardiac CT to size root.  She has a lot of things planned for the spring and summer. Told her she should probably Consider valve replacement this year.   ROS: Denies fever, malais, weight loss, blurry vision, decreased visual acuity, cough, sputum, SOB, hemoptysis, pleuritic pain, palpitaitons, heartburn, abdominal pain, melena, lower extremity edema, claudication, or rash.  All other systems reviewed and negative  General: Affect appropriate Healthy:  appears stated age 82: normal  Bronchitic voice Neck supple with no adenopathy JVP normal no bruits no thyromegaly Lungs clear with no wheezing and good diaphragmatic motion Heart:  S1/S2 is preserved AS murmur  no rub, gallop or click PMI normal Abdomen: benighn, BS positve, no tenderness, no AAA no bruit.  No HSM or HJR Distal pulses intact with no bruits No edema Neuro non-focal Skin warm and dry No muscular weakness   Current Outpatient Prescriptions  Medication Sig Dispense Refill  . multivitamin (THERAGRAN) per tablet Take 1 tablet by mouth daily.      . sertraline (ZOLOFT) 100 MG tablet Take 100 mg by mouth daily.     No current facility-administered medications for this visit.     Allergies  Penicillins  Electrocardiogram:   2014  SR rate 69 normal  11/30/16 SR rate 67 normal   Assessment and Plan Bicuspid AV:  With progression of mean gradient from 27 to 40 mmHg and peak from 44 to 61 mmHg EF preserved. Right and left cath scheduled with Dr Burt Knack next week. Labs today Risks including stroke death contrast reaction and need for emergency surgery ( her father died during a heart cath at a hospital That did not have surgical back up)   Orders written Will then set up to see Dr Cyndia Bent  post cath She will likely need a mechanical valve given her young age  Aneurysm:  Do not want to give her contrast twice in a week. Suspect we will do cardiac CT after she Sees Dr Cyndia Bent to size root which was 4.3 cm by echo   Smoking:  Congratulated her on quitting lungs clear no active wheezing still needs yearly CXR for 5 years   Depression.  Continue zoloft doing better since daughter has recovered from her addiction   Jenkins Rouge

## 2016-12-26 NOTE — Telephone Encounter (Signed)
Patient has appointment with Dr. Cyndia Bent on 01/01/17. Will send message to [facility] to call patient to schedule for CTA in the next two weeks.

## 2016-12-27 MED FILL — Verapamil HCl IV Soln 2.5 MG/ML: INTRAVENOUS | Qty: 2 | Status: AC

## 2017-01-01 ENCOUNTER — Encounter: Payer: Self-pay | Admitting: Surgery

## 2017-01-01 ENCOUNTER — Institutional Professional Consult (permissible substitution) (INDEPENDENT_AMBULATORY_CARE_PROVIDER_SITE_OTHER): Payer: BLUE CROSS/BLUE SHIELD | Admitting: Surgery

## 2017-01-01 VITALS — BP 138/85 | HR 80 | Resp 20 | Ht 64.0 in | Wt 129.0 lb

## 2017-01-01 DIAGNOSIS — Q230 Congenital stenosis of aortic valve: Secondary | ICD-10-CM

## 2017-01-01 DIAGNOSIS — Q231 Congenital insufficiency of aortic valve: Secondary | ICD-10-CM

## 2017-01-01 NOTE — Progress Notes (Signed)
Cardiothoracic Surgery Consultation  PCP is Horatio Pel, MD Referring Provider is Josue Hector, MD  Chief Complaint  Patient presents with  . Aortic Stenosis    Surgical eval, ECHO 12/14/16, Cardic Cath 12/26/16    HPI:  The patient is a 56 year old woman with a history of bicuspid aortic valve disease that was diagnosed in her early 31's when she was living in [place] and a heart murmur was noted. She moved back to [place] and was followed there for a few years before moving to [place] about 10 years ago. Her echo in 07/2015 showed moderate AS with a mean gradient of 26 mm Hg and a peak velocity ratio of 0.28. Her most recent echo on 12/14/2016 shows progression to severe AS with a mean gradient of 40 mm Hg with a peak velocity ratio of 0.23. Her LVEF is normal at 60-65%. She underwent cath on 12/26/2016 showing widely patent coronary arteries. There was 3+ angiographic AI and normal right heart pressures. She has some enlargement of the aortic root and ascending aorta that has been followed by MRA. The last study on 08/09/2015 showed the sinus of valsalva to be 4.0 cm, STJ 3.6 cm, and ascending aorta 4.4 cm. Her descending aorta was 2.3 cm.She says that she feels fairly well overall. She still walks 2.5 miles daily at a good pace with her dog and has mild shortness of breath and is tired by the end but does not have to stop and does not feel limited. She does report fatigue and occasional dizziness. She has some orthopnea at times.    Past Medical History:  Diagnosis Date  . Aortic stenosis   . Bicuspid aortic valve    Previously seen in [place]; moderate AS and mild AR  . Chronic neck pain   . Congenital heart valve abnormality   . Heart murmur   . Paroxysmal atrial fibrillation (HCC)   . Vertigo     Past Surgical History:  Procedure Laterality Date  . ENDOMETRIAL ABLATION    . HERNIA REPAIR    . KNEE SURGERY     Multiple  . LAPAROSCOPY     x2  . TUBAL LIGATION       Family History  Problem Relation Age of Onset  . Hypertension Mother   . Heart attack Mother   . Hypertension Father   . Heart attack Father     Social History Social History  Substance Use Topics  . Smoking status: Current Some Day Smoker    Last attempt to quit: 09/03/2009  . Smokeless tobacco: Never Used     Comment: Quit using Chantix.  Marland Kitchen Alcohol use Yes    Current Outpatient Prescriptions  Medication Sig Dispense Refill  . ibuprofen (ADVIL,MOTRIN) 200 MG tablet Take 400 mg by mouth 2 (two) times daily as needed for mild pain.    Marland Kitchen loratadine (CLARITIN) 10 MG tablet Take 10 mg by mouth daily as needed for allergies.    . Multiple Vitamin (MULTIVITAMIN WITH MINERALS) TABS tablet Take 1 tablet by mouth daily.    . sertraline (ZOLOFT) 100 MG tablet Take 100 mg by mouth daily.     No current facility-administered medications for this visit.     Allergies  Allergen Reactions  . Penicillins Hives    Has patient had a PCN reaction causing immediate rash, facial/tongue/throat swelling, SOB or lightheadedness with hypotension: unknown Has patient had a PCN reaction causing severe rash involving mucus membranes or skin  necrosis:unknown Has patient had a PCN reaction that required hospitalization no Has patient had a PCN reaction occurring within the last 10 years: no If all of the above answers are "NO", then may proceed with Cephalosporin use.     Review of Systems  Constitutional: Positive for fatigue. Negative for activity change, appetite change, chills, fever and unexpected weight change.  HENT: Negative.        Sees her dentist regularly and has had no problems with teeth.  Eyes: Negative.   Respiratory: Negative.   Cardiovascular: Negative for chest pain, palpitations and leg swelling.  Gastrointestinal: Negative.   Endocrine: Negative.   Genitourinary: Negative.   Musculoskeletal: Negative.   Skin: Negative.   Allergic/Immunologic: Negative.   Neurological:  Positive for dizziness. Negative for syncope.  Hematological: Bruises/bleeds easily.  Psychiatric/Behavioral: The patient is nervous/anxious.     BP 138/85   Pulse 80   Resp 20   Ht 5\' 4"  (1.626 m)   Wt 129 lb (58.5 kg)   SpO2 99% Comment: RA  BMI 22.14 kg/m  Physical Exam  Constitutional: She is oriented to person, place, and time. She appears well-developed and well-nourished. No distress.  HENT:  Head: Normocephalic and atraumatic.  Mouth/Throat: Oropharynx is clear and moist.  Eyes: EOM are normal. Pupils are equal, round, and reactive to light.  Neck: Normal range of motion. Neck supple. No JVD present. No tracheal deviation present. No thyromegaly present.  Cardiovascular: Normal rate, regular rhythm and intact distal pulses.   Murmur heard. 3/6 systolic murmur RSB, no diastolic murmur.  Pulmonary/Chest: Effort normal and breath sounds normal. No respiratory distress. She has no rales.  Abdominal: Soft. Bowel sounds are normal. She exhibits no distension and no mass. There is no tenderness.  Musculoskeletal: Normal range of motion. She exhibits no edema.  Lymphadenopathy:    She has no cervical adenopathy.  Neurological: She is alert and oriented to person, place, and time. She has normal strength. No cranial nerve deficit or sensory deficit.  Skin: Skin is warm and dry.  Psychiatric: She has a normal mood and affect.     Diagnostic Tests:     [facility]                        [address], [place] [address]                            [phone]  ------------------------------------------------------------------- Echocardiography  Patient:    Karlyn, Glasco MR #:       [id number] Study Date: 12/14/2016 Gender:     F Age:        37 Height:     162.6 cm Weight:     60.2 kg BSA:        1.65 m^2 Pt. Status: Room:   ATTENDING    Jenkins Rouge, M.D.  ORDERING     Jenkins Rouge, M.D.  REFERRING    Jenkins Rouge,  M.D.  SONOGRAPHER  Cindy Hazy, RDCS  PERFORMING   [facility], Outpatient  cc:  ------------------------------------------------------------------- LV EF: 60% -   65%  ------------------------------------------------------------------- Indications:      I35.9 Aortic Valve Disorder.  ------------------------------------------------------------------- History:   PMH:  Acquired from the patient and from the patient&'s chart.  PMH:  Bicuspid Aortic Valve. Dyspnea.  Risk factors: Former tobacco use.  ------------------------------------------------------------------- Study Conclusions  - Left ventricle: The cavity size was normal. Systolic function was   normal. The estimated ejection fraction was in the range of 60%   to 65%. Wall motion was normal; there were no regional wall   motion abnormalities. Doppler parameters are consistent with   abnormal left ventricular relaxation (grade 1 diastolic   dysfunction). - Aortic valve: Bicuspid; severely thickened, severely calcified   leaflets; fusion of the right-left coronary commissure. Valve   mobility was restricted. There was severe stenosis. There was   mild regurgitation. Peak velocity (S): 389 cm/s. Mean gradient   (S): 40 mm Hg. - Aorta: Ascending aortic diameter: 43 mm (S). - Ascending aorta: The ascending aorta was mildly dilated. - Mitral valve: Transvalvular velocity was within the normal range.   There was no evidence for stenosis. There was trivial   regurgitation. - Right ventricle: The cavity size was normal. Wall thickness was   normal. Systolic function was normal. - Atrial septum: No defect or patent foramen ovale was identified. - Tricuspid valve: There was mild-moderate regurgitation. - Pulmonary arteries: Systolic pressure was within the normal   range. PA peak pressure: 30 mm Hg (S).  ------------------------------------------------------------------- Study data:   Study status:  Routine.  Procedure:   The patient reported no pain pre or post test. Transthoracic echocardiography for left ventricular function evaluation, for right ventricular function evaluation, and for assessment of valvular function. Image quality was adequate.  Study completion:  There were no complications.          Echocardiography.  M-mode, complete 2D, spectral Doppler, and color Doppler.  Birthdate:  Patient birthdate: 12/22/1960.  Age:  Patient is 56 yr old.  Sex:  Gender: female.    BMI: 22.8 kg/m^2.  Blood pressure:     120/76  Patient status:  Outpatient.  Study date:  Study date: 12/14/2016. Study time: 04:03 PM.  Location:  [facility]  -------------------------------------------------------------------  ------------------------------------------------------------------- Left ventricle:  The cavity size was normal. Systolic function was normal. The estimated ejection fraction was in the range of 60% to 65%. Wall motion was normal; there were no regional wall motion abnormalities. Doppler parameters are consistent with abnormal left ventricular relaxation (grade 1 diastolic dysfunction). There was no evidence of elevated ventricular filling pressure by Doppler parameters.  ------------------------------------------------------------------- Aortic valve:   Bicuspid; severely thickened, severely calcified leaflets; fusion of the right-left coronary commissure. Valve mobility was restricted.  Doppler:   There was severe stenosis. There was mild regurgitation.    VTI ratio of LVOT to aortic valve: 0.23. Valve area (VTI): 0.86 cm^2. Indexed valve area (VTI): 0.52 cm^2/m^2. Peak velocity ratio of LVOT to aortic valve: 0.23. Valve area (Vmax): 0.87 cm^2. Indexed valve area (Vmax): 0.53 cm^2/m^2. Mean velocity ratio of LVOT to aortic valve: 0.24. Valve area (Vmean): 0.9 cm^2. Indexed valve area (Vmean): 0.54 cm^2/m^2. Mean gradient (S): 40 mm Hg. Peak gradient (S): 61 mm  Hg.  ------------------------------------------------------------------- Aorta:  Aortic root: The aortic root was normal in size. Ascending aorta: The ascending aorta was mildly dilated.  ------------------------------------------------------------------- Mitral valve:   Structurally normal valve.   Mobility was not restricted.  Doppler:  Transvalvular velocity was within the normal range. There was no evidence for stenosis. There was trivial regurgitation.  ------------------------------------------------------------------- Left atrium:  The atrium was normal in size.  ------------------------------------------------------------------- Atrial septum:  No defect or patent foramen ovale was identified.   ------------------------------------------------------------------- Right  ventricle:  The cavity size was normal. Wall thickness was normal. Systolic function was normal.  ------------------------------------------------------------------- Pulmonic valve:    Structurally normal valve.   Cusp separation was normal.  Doppler:  Transvalvular velocity was within the normal range. There was no evidence for stenosis. There was no regurgitation.  ------------------------------------------------------------------- Tricuspid valve:   Structurally normal valve.    Doppler: Transvalvular velocity was within the normal range. There was mild-moderate regurgitation.  ------------------------------------------------------------------- Pulmonary artery:   The main pulmonary artery was normal-sized. Systolic pressure was within the normal range.  ------------------------------------------------------------------- Right atrium:  The atrium was normal in size.  ------------------------------------------------------------------- Pericardium:  There was no pericardial effusion.  ------------------------------------------------------------------- Systemic veins: Inferior vena cava:  The vessel was normal in size. The respirophasic diameter changes were in the normal range (>= 50%), consistent with normal central venous pressure.  ------------------------------------------------------------------- Measurements   Left ventricle                            Value          Reference  LV ID, ED, PLAX chordal                   48.5  mm       43 - 52  LV ID, ES, PLAX chordal                   28.1  mm       23 - 38  LV fx shortening, PLAX chordal            42    %        >=29  LV PW thickness, ED                       8.95  mm       ---------  IVS/LV PW ratio, ED                       1.12           <=1.3  Stroke volume, 2D                         82    ml       ---------  Stroke volume/bsa, 2D                     50    ml/m^2   ---------  LV e&', lateral                            8.55  cm/s     ---------  LV E/e&', lateral                          7.85           ---------  LV e&', medial                             8.22  cm/s     ---------  LV E/e&', medial                           8.16           ---------  LV e&', average                            8.39  cm/s     ---------  LV E/e&', average                          8              ---------    Ventricular septum                        Value          Reference  IVS thickness, ED                         9.99  mm       ---------    LVOT                                      Value          Reference  LVOT ID, S                                22    mm       ---------  LVOT area                                 3.8   cm^2     ---------  LVOT ID                                   22    mm       ---------  LVOT peak velocity, S                     89.5  cm/s     ---------  LVOT mean velocity, S                     71.8  cm/s     ---------  LVOT VTI, S                               21.5  cm       ---------  LVOT peak gradient, S                     3     mm Hg    ---------  Stroke volume (SV), LVOT DP               81.7  ml        ---------  Stroke index (SV/bsa), LVOT DP            49.4  ml/m^2   ---------    Aortic valve                              Value          Reference  Aortic valve peak  velocity, S             389   cm/s     ---------  Aortic valve mean velocity, S             304   cm/s     ---------  Aortic valve VTI, S                       95.2  cm       ---------  Aortic mean gradient, S                   40    mm Hg    ---------  Aortic peak gradient, S                   61    mm Hg    ---------  VTI ratio, LVOT/AV                        0.23           ---------  Aortic valve area, VTI                    0.86  cm^2     ---------  Aortic valve area/bsa, VTI                0.52  cm^2/m^2 ---------  Velocity ratio, peak, LVOT/AV             0.23           ---------  Aortic valve area, peak velocity          0.87  cm^2     ---------  Aortic valve area/bsa, peak               0.53  cm^2/m^2 ---------  velocity  Velocity ratio, mean, LVOT/AV             0.24           ---------  Aortic valve area, mean velocity          0.9   cm^2     ---------  Aortic valve area/bsa, mean               0.54  cm^2/m^2 ---------  velocity  Aortic regurg pressure half-time          706   ms       ---------    Aorta                                     Value          Reference  Aortic root ID, ED                        37    mm       ---------  Ascending aorta ID, A-P, S                43    mm       ---------    Left atrium                               Value          Reference  LA  ID, A-P, ES                            36    mm       ---------  LA ID/bsa, A-P                            2.18  cm/m^2   <=2.2  LA volume, S                              57    ml       ---------  LA volume/bsa, S                          34.5  ml/m^2   ---------  LA volume, ES, 1-p A4C                    55    ml       ---------  LA volume/bsa, ES, 1-p A4C                33.2  ml/m^2   ---------  LA volume, ES, 1-p A2C                     59    ml       ---------  LA volume/bsa, ES, 1-p A2C                35.7  ml/m^2   ---------    Mitral valve                              Value          Reference  Mitral E-wave peak velocity               67.1  cm/s     ---------  Mitral A-wave peak velocity               107   cm/s     ---------  Mitral deceleration time                  201   ms       150 - 230  Mitral E/A ratio, peak                    0.6            ---------    Pulmonary arteries                        Value          Reference  PA pressure, S, DP                        30    mm Hg    <=30    Tricuspid valve                           Value          Reference  Tricuspid regurg peak velocity            258   cm/s     ---------  Tricuspid peak RV-RA gradient             27    mm Hg    ---------    Systemic veins                            Value          Reference  Estimated CVP                             3     mm Hg    ---------    Right ventricle                           Value          Reference  RV pressure, S, DP                        30    mm Hg    <=30  RV s&', lateral, S                         15.8  cm/s     ---------  Legend: (L)  and  (H)  mark values outside specified reference range.  ------------------------------------------------------------------- Prepared and Electronically Authenticated by  Skeet Latch, MD 2018-04-13T23:28:36     Procedures   Right Heart Cath and Coronary Angiography  Conclusion     There is moderate (3+) aortic regurgitation.   1. Widely patent coronary arteries 2. Bicuspid aortic valve with fluoroscopic evidence of heavy calcification and restricted leaflet mobility, known severe aortic stenosis by noninvasive assessment, and 3+ aortic insufficiency by aortic root angiography 3. Normal right heart hemodynamics   Indications   Severe aortic stenosis [I35.0 (ICD-10-CM)]  Procedural Details/Technique   Technical Details INDICATION: Severe bicuspid  aortic valve stenosis  PROCEDURAL DETAILS: There was an indwelling IV in a right antecubital vein. Using normal sterile technique, the IV was changed out for a 5 Fr brachial sheath over a 0.018 inch wire. The right wrist was then prepped, draped, and anesthetized with 1% lidocaine. Using the modified Seldinger technique a 5/6 French Slender sheath was placed in the right radial artery. Intra-arterial verapamil was administered through the radial artery sheath. IV heparin was administered after a JR4 catheter was advanced into the central aorta. A Swan-Ganz catheter was used for the right heart catheterization. Standard protocol was followed for recording of right heart pressures and sampling of oxygen saturations. Fick cardiac output was calculated. The JR4 catheter is advanced over a versicore wire into the ascending aorta. The catheter was difficult to manipulate. I was able to image the RCA which is a small, nondominant vessel. There was a gradient of resistance trying to remove the JR4 catheter. The catheter was pulled back into the subclavian artery and intra-arterial nitroglycerin and verapamil were administered. The patient was given more sedation. Further doses of verapamil and nitroglycerin were given further down in the arm as well as through the sheath. Ultimately was able to remove the catheter. Attention was then turned to the right groin. A 4 French sheath was inserted via a frontal wall puncture using the modified Seldinger technique. A JL 5 catheter was used to image the left coronary artery. Up a tail catheter was advanced for aortic root angiography. A power injection  was done. There were no immediate procedural complications. The patient was transferred to the post catheterization recovery area for further monitoring.     Estimated blood loss <50 mL.  During this procedure the patient was administered the following to achieve and maintain moderate conscious sedation: Versed 6 mg, Fentanyl  100 mcg, while the patient's heart rate, blood pressure, and oxygen saturation were continuously monitored. The period of conscious sedation was 61 minutes, of which I was present face-to-face 100% of this time.    Coronary Findings   Dominance: Left  Left Anterior Descending  Vessel is angiographically normal.  Left Circumflex  Vessel is angiographically normal.  Right Coronary Artery  Vessel is angiographically normal.  Left Heart   Aortic Valve There is moderate (3+) aortic regurgitation. The aortic valve is calcified. There is restricted aortic valve motion. Aortic root angiography demonstrates 3+ aortic insufficiency. The aortic valve appears bicuspid and is severely calcified with restricted mobility.    Coronary Diagrams   Diagnostic Diagram       Implants     No implant documentation for this case.  PACS Images   Show images for Cardiac catheterization   Link to Procedure Log   Procedure Log    Hemo Data    Most Recent Value  Fick Cardiac Output 4.34 L/min  Fick Cardiac Output Index 2.66 (L/min)/BSA  RA A Wave 5 mmHg  RA V Wave 3 mmHg  RA Mean 2 mmHg  RV Systolic Pressure 22 mmHg  RV Diastolic Pressure 0 mmHg  RV EDP 3 mmHg  PA Systolic Pressure 20 mmHg  PA Diastolic Pressure 8 mmHg  PA Mean 13 mmHg  PW A Wave 8 mmHg  PW V Wave 7 mmHg  PW Mean 6 mmHg  AO Systolic Pressure 86 mmHg  AO Diastolic Pressure 54 mmHg  AO Mean 68 mmHg  QP/QS 1  TPVR Index 4.89 HRUI  TSVR Index 25.54 HRUI  PVR SVR Ratio 0.11  TPVR/TSVR Ratio 0.19    Impression:  The patient is a 56 year old woman with stage D severe, symptomatic aortic stenosis with exertional fatigue and mild shortness of breath that is not keeping her from normal activities at this time. I have personally reviewed her echo, cath and MRA studies. She has a bicuspid aortic valve with severely thickened and calcified leaflets with fusion of the right-left commissure. Valve mobility is restricted with severe  AS and a mean gradient of 40 mm Hg. She has mild dilation of the ascending aorta to 4.3-4.4 cm and this has been stable since 2011. Her recent echo measured the ascending aorta at 4.3 cm. She says that Dr. Johnsie Cancel is goin to schedule her  for a  cardiac CT to assess this further in the near future since her last MRA was 2 years ago. I think AVR with a mechanical valve is the best option for her. Her aorta is approaching the 4.5 cm diameter where we recommend replacement during surgery for bicuspid aortic valve disease . I would wait until the CT is done to make that recommendation. Her cath shows no coronary disease. I reviewed these studies with her and showed her the MRA images. I discussed the surgical procedure, why she is not a TAVR candidate and the pros and cons of mechanical and tissue valves. She would like to wait until July to do surgery because she has some traveling to do over the next couple months and her daughters will be home from college over the  summer to help her. She has mild symptoms that do not limit her activity at this time so she should be ok waiting until July.  Plan:  She will think about timing and call our office to schedule. I will see her back prior to surgery to review the CTA results and discuss further surgical plans.    I spent 60 minutes performing this consultation and > 50% of this time was spent face to face counseling and coordinating the care of this patient's severe aortic stenosis.   Gaye Pollack, MD [facility] [phone]

## 2017-01-02 NOTE — Progress Notes (Signed)
Will do her cardiac CT on the new Siemens force scanner at end of June if possible

## 2017-01-07 ENCOUNTER — Ambulatory Visit: Payer: BLUE CROSS/BLUE SHIELD | Admitting: Cardiology

## 2017-01-09 ENCOUNTER — Telehealth: Payer: Self-pay | Admitting: Cardiovascular Disease

## 2017-01-09 NOTE — Telephone Encounter (Signed)
New message    Pt is calling about CT scan. She said no body has called her to schedule this.

## 2017-01-09 NOTE — Telephone Encounter (Signed)
Called patient back to let her know scheduling will be calling her today to set up appointment for her CT. Patient verbalized understanding.

## 2017-01-10 ENCOUNTER — Encounter: Payer: Self-pay | Admitting: Cardiovascular Disease

## 2017-01-15 ENCOUNTER — Ambulatory Visit (HOSPITAL_COMMUNITY): Payer: BLUE CROSS/BLUE SHIELD

## 2017-01-31 ENCOUNTER — Ambulatory Visit (HOSPITAL_COMMUNITY): Admission: RE | Admit: 2017-01-31 | Payer: BLUE CROSS/BLUE SHIELD | Source: Ambulatory Visit

## 2017-01-31 ENCOUNTER — Ambulatory Visit (HOSPITAL_COMMUNITY)
Admission: RE | Admit: 2017-01-31 | Discharge: 2017-01-31 | Disposition: A | Discharge status: Home / Self Care | Payer: BLUE CROSS/BLUE SHIELD | Source: Ambulatory Visit | Attending: Cardiovascular Disease | Admitting: Cardiovascular Disease

## 2017-01-31 ENCOUNTER — Encounter (HOSPITAL_COMMUNITY): Payer: Self-pay

## 2017-01-31 DIAGNOSIS — I350 Nonrheumatic aortic (valve) stenosis: Secondary | ICD-10-CM

## 2017-01-31 DIAGNOSIS — I2510 Atherosclerotic heart disease of native coronary artery without angina pectoris: Secondary | ICD-10-CM | POA: Insufficient documentation

## 2017-01-31 DIAGNOSIS — I712 Thoracic aortic aneurysm, without rupture: Secondary | ICD-10-CM | POA: Insufficient documentation

## 2017-01-31 MED ORDER — IOPAMIDOL (ISOVUE-370) INJECTION 76%
INTRAVENOUS | Status: AC
  Administered 2017-01-31: 80 mL
  Filled 2017-01-31: qty 100

## 2017-02-06 ENCOUNTER — Other Ambulatory Visit: Payer: Self-pay | Admitting: *Deleted

## 2017-02-06 DIAGNOSIS — I77810 Thoracic aortic ectasia: Secondary | ICD-10-CM

## 2017-02-06 DIAGNOSIS — Q231 Congenital insufficiency of aortic valve: Secondary | ICD-10-CM

## 2017-02-06 DIAGNOSIS — I350 Nonrheumatic aortic (valve) stenosis: Secondary | ICD-10-CM

## 2017-02-14 ENCOUNTER — Ambulatory Visit: Payer: BLUE CROSS/BLUE SHIELD | Admitting: Cardiovascular Disease

## 2017-02-15 ENCOUNTER — Other Ambulatory Visit: Payer: Self-pay | Admitting: Internal Medicine

## 2017-02-15 DIAGNOSIS — Z1231 Encounter for screening mammogram for malignant neoplasm of breast: Secondary | ICD-10-CM

## 2017-02-26 ENCOUNTER — Ambulatory Visit (INDEPENDENT_AMBULATORY_CARE_PROVIDER_SITE_OTHER): Payer: BLUE CROSS/BLUE SHIELD | Admitting: Surgery

## 2017-02-26 ENCOUNTER — Encounter: Payer: Self-pay | Admitting: Surgery

## 2017-02-26 VITALS — BP 130/83 | HR 70 | Resp 20 | Ht 64.0 in | Wt 129.0 lb

## 2017-02-26 DIAGNOSIS — Q230 Congenital stenosis of aortic valve: Secondary | ICD-10-CM | POA: Diagnosis not present

## 2017-02-26 DIAGNOSIS — I77810 Thoracic aortic ectasia: Secondary | ICD-10-CM | POA: Diagnosis not present

## 2017-02-26 DIAGNOSIS — Q231 Congenital insufficiency of aortic valve: Secondary | ICD-10-CM

## 2017-02-26 DIAGNOSIS — I4891 Unspecified atrial fibrillation: Secondary | ICD-10-CM | POA: Diagnosis not present

## 2017-02-28 ENCOUNTER — Ambulatory Visit
Admission: RE | Admit: 2017-02-28 | Discharge: 2017-02-28 | Disposition: A | Discharge status: Home / Self Care | Payer: BLUE CROSS/BLUE SHIELD | Source: Ambulatory Visit | Attending: Internal Medicine | Admitting: Internal Medicine

## 2017-02-28 DIAGNOSIS — Z1231 Encounter for screening mammogram for malignant neoplasm of breast: Secondary | ICD-10-CM | POA: Diagnosis not present

## 2017-03-01 ENCOUNTER — Encounter: Payer: Self-pay | Admitting: Surgery

## 2017-03-01 NOTE — Progress Notes (Signed)
HPI:  The patient returns to review the results of her gated cardiac CT and make final plans for surgery severe bicuspid aortic valve stenosis with regurgitation. She has been feeling well but has fatigue and shortness of breath with moderate exertion. Her CT shows the ascending aorta to be moderately dilated to 4.6 cm. The sinus portion is 4.2 cm and the STJ 3.6 cm.   Current Outpatient Prescriptions  Medication Sig Dispense Refill  . ibuprofen (ADVIL,MOTRIN) 200 MG tablet Take 400 mg by mouth 2 (two) times daily as needed for mild pain.    Marland Kitchen loratadine (CLARITIN) 10 MG tablet Take 10 mg by mouth daily as needed for allergies.    . Multiple Vitamin (MULTIVITAMIN WITH MINERALS) TABS tablet Take 1 tablet by mouth daily.    . sertraline (ZOLOFT) 100 MG tablet Take 100 mg by mouth daily.     No current facility-administered medications for this visit.      Physical Exam: BP 130/83   Pulse 70   Resp 20   Ht 5\' 4"  (1.626 m)   Wt 129 lb (58.5 kg)   BMI 22.14 kg/m  She looks well Cardiac exam shows a regular rate and rhythm 3/6 systolic murmur RSB, no diastolic murmur Lungs are clear   Diagnostic Tests:  ADDENDUM REPORT: 01/31/2017 13:35  CLINICAL DATA:  Aortic stenosis  EXAM: Cardiac CT  TECHNIQUE: The patient was scanned on a Siemens 469 slice scanner. A 120 kV retrospective scan was triggered in the ascending thoracic aorta at 120 HU's. Gantry rotation speed was 300 msecs and collimation was .6 mm. No beta blockade or nitro were given. The 3D data set was reconstructed in 10 % intervals of the R-R cycle. Systolic and diastolic phases were analyzed on a dedicated work station using MPR, MIP and VRT modes. The patient received 80 cc of contrast.  FINDINGS: Aortic Valve: Bicuspid with fused right and left cusps. Sievers type one Heavily calcified with restricted motion  Aorta: Moderate aortic root dilatation 4.6 cm Normal great vessel origins. Mild  atherosclerosis and calcification of the arch No coarctation  Sinotubular Junction:  3.6 cm  Ascending Thoracic Aorta:  4.6 cm  Aortic Arch:  3.5 cm  Descending Thoracic Aorta:  2.3 cm  Sinus:  4.2 cm  Coronary Artery Height above Annulus:  Left Main:  15 mm  Right Coronary:  17 mm  Virtual Basal Annulus Measurements:  Maximum/Minimum Diameter:  26.4 x 21.7 mm  Area:  478 mm2  Coronary Arteries: Left dominant with some calcification of the mid LAD RCA is diminutive  IMPRESSION: 1) Sievers type 1 bicuspid aortic valve with fused right and left cusp  2) Left dominant coronary arteries with calcification of the mid LAD  3) Moderate aortic root dilatation 4.6 cm  Jenkins Rouge   Electronically Signed   By: Jenkins Rouge M.D.   On: 01/31/2017 13:35   Impression:  She has stage D severe symptomatic bicuspid aortic valve stenosis with moderate AI. Her MRA of the chest in 2016 showed mild dilation of the ascending aorta to 4.4 cm and her current cardiac CT shows the diameter of the ascending aorta to be 4.6 cm. With a bicuspid aortic valve I think the best treatment is Bentall procedure and replacement of the ascending aorta up to the proximal arch under deep hypothermic circulatory arrest. I reviewed the CT images with her and her husband by conference call. I discussed the surgical procedure in detail. I discussed  the options of mechanical and bioprosthetic valves and she says that she does not want to be on Coumadin and wants a bioprosthetic valve. She understands the risk of structural valve deterioration possibly requiring repeat surgery. I discussed the operative procedure with the patient and her husband including alternatives, benefits and risks; including but not limited to bleeding, blood transfusion, infection, stroke, myocardial infarction, graft failure, heart block requiring a permanent pacemaker, organ dysfunction, and death.  Clarene Duke Clausing  understands and agrees to proceed.     Plan:  Bentall procedure using a bioprosthetic valved graft and replacement of the ascending aortic aneurysm on 03/21/2017.  I spent 15 minutes performing this established patient evaluation and > 50% of this time was spent face to face counseling and coordinating the care of this patient's severe bicuspid aortic stenosis and aortic aneurysm.    Gaye Pollack, MD [facility] [phone]

## 2017-03-09 ENCOUNTER — Telehealth: Payer: Self-pay | Admitting: Student

## 2017-03-09 NOTE — Telephone Encounter (Signed)
  Patient called the after-hours line asking for an antibiotic for a sinus infection, as she has been experiencing nasal drainage and congestion. Concerned as she has upcoming aortic valve surgery on 7/19.  I informed her that she should follow-up with her PCP on Monday as she needs to be thoroughly evaluated before being started on antibiotics.I tried calling the office (Dr. Shelia Media) and leaving a message but could not get through. I recommended she try taking OTC Mucinex or Sudafed and follow-up with her PCP on Monday.  She voiced understanding of this and was appreciative of the call.   Signed, Erma Heritage, PA-C 03/09/2017, 10:47 AM Pager: [phone]

## 2017-03-13 DIAGNOSIS — J019 Acute sinusitis, unspecified: Secondary | ICD-10-CM | POA: Diagnosis not present

## 2017-03-19 ENCOUNTER — Encounter (HOSPITAL_COMMUNITY)
Admission: RE | Admit: 2017-03-19 | Discharge: 2017-03-19 | Disposition: A | Discharge status: Home / Self Care | Payer: BLUE CROSS/BLUE SHIELD | Source: Ambulatory Visit | Attending: Surgery | Admitting: Surgery

## 2017-03-19 ENCOUNTER — Ambulatory Visit (HOSPITAL_COMMUNITY)
Admission: RE | Admit: 2017-03-19 | Discharge: 2017-03-19 | Disposition: A | Discharge status: Home / Self Care | Payer: BLUE CROSS/BLUE SHIELD | Source: Ambulatory Visit | Attending: Surgery | Admitting: Surgery

## 2017-03-19 ENCOUNTER — Encounter (HOSPITAL_COMMUNITY): Payer: Self-pay

## 2017-03-19 ENCOUNTER — Ambulatory Visit (HOSPITAL_BASED_OUTPATIENT_CLINIC_OR_DEPARTMENT_OTHER)
Admission: RE | Admit: 2017-03-19 | Discharge: 2017-03-19 | Disposition: A | Discharge status: Home / Self Care | Payer: BLUE CROSS/BLUE SHIELD | Source: Ambulatory Visit | Attending: Surgery | Admitting: Surgery

## 2017-03-19 DIAGNOSIS — I77810 Thoracic aortic ectasia: Secondary | ICD-10-CM

## 2017-03-19 DIAGNOSIS — Q231 Congenital insufficiency of aortic valve: Secondary | ICD-10-CM | POA: Diagnosis not present

## 2017-03-19 DIAGNOSIS — J449 Chronic obstructive pulmonary disease, unspecified: Secondary | ICD-10-CM | POA: Insufficient documentation

## 2017-03-19 DIAGNOSIS — I350 Nonrheumatic aortic (valve) stenosis: Secondary | ICD-10-CM

## 2017-03-19 DIAGNOSIS — R918 Other nonspecific abnormal finding of lung field: Secondary | ICD-10-CM | POA: Diagnosis not present

## 2017-03-19 DIAGNOSIS — I491 Atrial premature depolarization: Secondary | ICD-10-CM | POA: Diagnosis not present

## 2017-03-19 DIAGNOSIS — Z87891 Personal history of nicotine dependence: Secondary | ICD-10-CM | POA: Diagnosis not present

## 2017-03-19 DIAGNOSIS — Z01818 Encounter for other preprocedural examination: Secondary | ICD-10-CM | POA: Diagnosis not present

## 2017-03-19 DIAGNOSIS — Z01810 Encounter for preprocedural cardiovascular examination: Secondary | ICD-10-CM | POA: Insufficient documentation

## 2017-03-19 HISTORY — DX: Other specified postprocedural states: Z98.890

## 2017-03-19 HISTORY — DX: Dyspnea, unspecified: R06.00

## 2017-03-19 HISTORY — DX: Anxiety disorder, unspecified: F41.9

## 2017-03-19 HISTORY — DX: Nausea with vomiting, unspecified: R11.2

## 2017-03-19 LAB — VAS US DOPPLER PRE CABG
LCCAPDIAS: 21 cm/s
LCCAPSYS: 65 cm/s
LEFT ECA DIAS: -26 cm/s
LEFT VERTEBRAL DIAS: -21 cm/s
Left CCA dist dias: -21 cm/s
Left CCA dist sys: -71 cm/s
Left ICA dist dias: -31 cm/s
Left ICA dist sys: -68 cm/s
Left ICA prox dias: -34 cm/s
Left ICA prox sys: -81 cm/s
RCCAPDIAS: -12 cm/s
RIGHT ECA DIAS: -17 cm/s
RIGHT VERTEBRAL DIAS: -6 cm/s
Right CCA prox sys: -67 cm/s
Right cca dist sys: -66 cm/s

## 2017-03-19 LAB — PULMONARY FUNCTION TEST
DL/VA % pred: 76 %
DL/VA: 3.68 ml/min/mmHg/L
DLCO UNC % PRED: 77 %
DLCO unc: 18.7 ml/min/mmHg
FEF 25-75 PRE: 1.78 L/s
FEF 25-75 Post: 2.26 L/sec
FEF2575-%CHANGE-POST: 26 %
FEF2575-%PRED-POST: 87 %
FEF2575-%Pred-Pre: 69 %
FEV1-%CHANGE-POST: 7 %
FEV1-%PRED-POST: 102 %
FEV1-%PRED-PRE: 94 %
FEV1-POST: 2.76 L
FEV1-Pre: 2.56 L
FEV1FVC-%Change-Post: 7 %
FEV1FVC-%Pred-Pre: 89 %
FEV6-%CHANGE-POST: 1 %
FEV6-%PRED-POST: 107 %
FEV6-%PRED-PRE: 106 %
FEV6-PRE: 3.55 L
FEV6-Post: 3.6 L
FEV6FVC-%CHANGE-POST: 1 %
FEV6FVC-%PRED-PRE: 101 %
FEV6FVC-%Pred-Post: 102 %
FVC-%CHANGE-POST: 0 %
FVC-%PRED-POST: 105 %
FVC-%Pred-Pre: 105 %
FVC-Post: 3.63 L
FVC-Pre: 3.63 L
POST FEV1/FVC RATIO: 76 %
POST FEV6/FVC RATIO: 99 %
PRE FEV6/FVC RATIO: 98 %
Pre FEV1/FVC ratio: 71 %
RV % PRED: 121 %
RV: 2.28 L
TLC % pred: 115 %
TLC: 5.81 L

## 2017-03-19 LAB — CBC
HEMATOCRIT: 41.6 % (ref 36.0–46.0)
HEMOGLOBIN: 13.6 g/dL (ref 12.0–15.0)
MCH: 32.2 pg (ref 26.0–34.0)
MCHC: 32.7 g/dL (ref 30.0–36.0)
MCV: 98.3 fL (ref 78.0–100.0)
Platelets: 279 10*3/uL (ref 150–400)
RBC: 4.23 MIL/uL (ref 3.87–5.11)
RDW: 13 % (ref 11.5–15.5)
WBC: 4.5 10*3/uL (ref 4.0–10.5)

## 2017-03-19 LAB — URINALYSIS, ROUTINE W REFLEX MICROSCOPIC
BACTERIA UA: NONE SEEN
Bilirubin Urine: NEGATIVE
GLUCOSE, UA: NEGATIVE mg/dL
HGB URINE DIPSTICK: NEGATIVE
Ketones, ur: 5 mg/dL — AB
Nitrite: NEGATIVE
PROTEIN: NEGATIVE mg/dL
Specific Gravity, Urine: 1.02 (ref 1.005–1.030)
Squamous Epithelial / LPF: NONE SEEN
pH: 5 (ref 5.0–8.0)

## 2017-03-19 LAB — BLOOD GAS, ARTERIAL
ACID-BASE DEFICIT: 3.1 mmol/L — AB (ref 0.0–2.0)
BICARBONATE: 20.8 mmol/L (ref 20.0–28.0)
Drawn by: 470591
FIO2: 21
O2 Saturation: 97.7 %
PH ART: 7.412 (ref 7.350–7.450)
PO2 ART: 100 mmHg (ref 83.0–108.0)
Patient temperature: 98.6
pCO2 arterial: 33.3 mmHg (ref 32.0–48.0)

## 2017-03-19 LAB — COMPREHENSIVE METABOLIC PANEL
ALBUMIN: 4.5 g/dL (ref 3.5–5.0)
ALK PHOS: 57 U/L (ref 38–126)
ALT: 15 U/L (ref 14–54)
ANION GAP: 13 (ref 5–15)
AST: 22 U/L (ref 15–41)
BUN: 16 mg/dL (ref 6–20)
CALCIUM: 9.6 mg/dL (ref 8.9–10.3)
CO2: 18 mmol/L — ABNORMAL LOW (ref 22–32)
Chloride: 104 mmol/L (ref 101–111)
Creatinine, Ser: 0.68 mg/dL (ref 0.44–1.00)
GFR calc non Af Amer: >60 mL/min (ref >60)
GLUCOSE: 97 mg/dL (ref 65–99)
Potassium: 3.9 mmol/L (ref 3.5–5.1)
Sodium: 135 mmol/L (ref 135–145)
TOTAL PROTEIN: 7.4 g/dL (ref 6.5–8.1)
Total Bilirubin: 0.6 mg/dL (ref 0.3–1.2)

## 2017-03-19 LAB — PROTIME-INR
INR: 1
PROTHROMBIN TIME: 13.2 s (ref 11.4–15.2)

## 2017-03-19 LAB — ABO/RH: ABO/RH(D): A NEG

## 2017-03-19 LAB — SURGICAL PCR SCREEN
MRSA, PCR: NEGATIVE
Staphylococcus aureus: NEGATIVE

## 2017-03-19 LAB — HCG, SERUM, QUALITATIVE: Preg, Serum: NEGATIVE

## 2017-03-19 LAB — APTT: aPTT: 33 seconds (ref 24–36)

## 2017-03-19 MED ORDER — ALBUTEROL SULFATE (2.5 MG/3ML) 0.083% IN NEBU
2.5000 mg | INHALATION_SOLUTION | Freq: Once | RESPIRATORY_TRACT | Status: AC
  Administered 2017-03-19: 2.5 mg via RESPIRATORY_TRACT

## 2017-03-19 NOTE — Progress Notes (Signed)
Pre-op Cardiac Surgery  Carotid Findings:  Bilateral 1-39% ICA stenosis, antegrade vertebral flow.   Upper Extremity Right Left  Brachial Pressures 119, Tri 112, Tri  Radial Waveforms Tri Tri  Ulnar Waveforms Tri Tri  Palmar Arch (Allen's Test) Waveform is unchanged with radial compression and diminished greater than 50% with ulnar compression. Waveform diminishes greater than 50% with radial compression and increases slightly with ulnar compression   [place], RVT 11:39 AM  03/19/2017

## 2017-03-19 NOTE — Progress Notes (Signed)
Pt denies any acute cardiopulmonary issues. Pt under the care of Dr. Johnsie Cancel, Cardiology. Pt stated that a stress test was performed by Dr. Johnsie Cancel; records requested. Pt chart forwarded to anesthesia.

## 2017-03-19 NOTE — Pre-Procedure Instructions (Signed)
Tawnee Clegg Bergsma  03/19/2017      [facility] [address] [place] [address] Phone: [phone] Fax: [phone]  [id number] [address] [place] [address] Phone: [phone] Fax: [phone]    Your procedure is scheduled on Thursday, March 21, 2017  Report to [place] at 5:30 A.M.  Call this number if you have problems the morning of surgery:  [phone]   Remember:  Do not eat food or drink liquids after midnight Wednesday, March 20, 2017  Take these medicines the morning of surgery with A SIP OF WATER :sertraline (ZOLOFT)  Stop taking vitamins, fish oil and herbal medications. Do not take any NSAIDs ie: Ibuprofen, Advil, Naproxen (Aleve), Motrin, BC and Goody Powder; stop now.   Do not wear jewelry, make-up or nail polish.  Do not wear lotions, powders, or perfumes, or deoderant.  Do not shave 48 hours prior to surgery.    Do not bring valuables to the hospital.  [facility] is not responsible for any belongings or valuables.  Contacts, dentures or bridgework may not be worn into surgery.  Leave your suitcase in the car.  After surgery it may be brought to your room. For patients admitted to the hospital, discharge time will be determined by your treatment team. Special instructions: Special Instructions:Special Instructions: [facility] - Preparing for Surgery  Before surgery, you can play an important role.  Because skin is not sterile, your skin needs to be as free of germs as possible.  You can reduce the number of germs on you skin by washing with CHG (chlorahexidine gluconate) soap before surgery.  CHG is an antiseptic cleaner which kills germs and bonds with the skin to continue killing germs even after washing.  Please DO NOT use if you have an allergy to CHG or antibacterial soaps.  If your skin  becomes reddened/irritated stop using the CHG and inform your nurse when you arrive at Short Stay.  Do not shave (including legs and underarms) for at least 48 hours prior to the first CHG shower.  You may shave your face.  Please follow these instructions carefully:   1.  Shower with CHG Soap the night before surgery and the morning of Surgery.  2.  If you choose to wash your hair, wash your hair first as usual with your normal shampoo.  3.  After you shampoo, rinse your hair and body thoroughly to remove the Shampoo.  4.  Use CHG as you would any other liquid soap.  You can apply chg directly  to the skin and wash gently with scrungie or a clean washcloth.  5.  Apply the CHG Soap to your body ONLY FROM THE NECK DOWN.  Do not use on open wounds or open sores.  Avoid contact with your eyes, ears, mouth and genitals (private parts).  Wash genitals (private parts) with your normal soap.  6.  Wash thoroughly, paying special attention to the area where your surgery will be performed.  7.  Thoroughly rinse your body with warm water from the neck down.  8.  DO NOT shower/wash with your normal soap after using and rinsing off the CHG Soap.  9.  Pat yourself dry with a clean towel.            10.  Wear clean pajamas.  11.  Place clean sheets on your bed the night of your first shower and do not sleep with pets.  Day of Surgery  Do not apply any lotions/deodorants the morning of surgery.  Please wear clean clothes to the hospital/surgery center.  Please read over the following fact sheets that you were given. Pain Booklet, Coughing and Deep Breathing, Blood Transfusion Information, MRSA Information and Surgical Site Infection Prevention

## 2017-03-20 LAB — HEMOGLOBIN A1C
HEMOGLOBIN A1C: 5.1 % (ref 4.8–5.6)
MEAN PLASMA GLUCOSE: 100 mg/dL

## 2017-03-20 MED ORDER — METOPROLOL TARTRATE 12.5 MG HALF TABLET
12.5000 mg | ORAL_TABLET | Freq: Once | ORAL | Status: AC
  Administered 2017-03-21: 12.5 mg via ORAL
  Filled 2017-03-20: qty 1

## 2017-03-20 MED ORDER — SODIUM CHLORIDE 0.9 % IV SOLN
INTRAVENOUS | Status: DC
  Filled 2017-03-20: qty 1

## 2017-03-20 MED ORDER — SODIUM CHLORIDE 0.9 % IV SOLN
INTRAVENOUS | Status: DC
  Filled 2017-03-20: qty 30

## 2017-03-20 MED ORDER — CHLORHEXIDINE GLUCONATE 0.12 % MT SOLN
15.0000 mL | Freq: Once | OROMUCOSAL | Status: AC
  Administered 2017-03-21: 15 mL via OROMUCOSAL
  Filled 2017-03-20: qty 15

## 2017-03-20 MED ORDER — PLASMA-LYTE 148 IV SOLN
INTRAVENOUS | Status: DC
  Filled 2017-03-20: qty 2.5

## 2017-03-20 MED ORDER — POTASSIUM CHLORIDE 2 MEQ/ML IV SOLN
80.0000 meq | INTRAVENOUS | Status: DC
  Filled 2017-03-20: qty 40

## 2017-03-20 MED ORDER — EPINEPHRINE PF 1 MG/ML IJ SOLN
0.0000 ug/min | INTRAVENOUS | Status: DC
  Filled 2017-03-20: qty 4

## 2017-03-20 MED ORDER — NITROGLYCERIN IN D5W 200-5 MCG/ML-% IV SOLN
2.0000 ug/min | INTRAVENOUS | Status: DC
  Filled 2017-03-20: qty 250

## 2017-03-20 MED ORDER — DOPAMINE-DEXTROSE 3.2-5 MG/ML-% IV SOLN
0.0000 ug/kg/min | INTRAVENOUS | Status: DC
  Filled 2017-03-20: qty 250

## 2017-03-20 MED ORDER — DEXMEDETOMIDINE HCL IN NACL 400 MCG/100ML IV SOLN
0.1000 ug/kg/h | INTRAVENOUS | Status: AC
  Administered 2017-03-21: .3 ug/kg/h via INTRAVENOUS
  Filled 2017-03-20: qty 100

## 2017-03-20 MED ORDER — TRANEXAMIC ACID (OHS) PUMP PRIME SOLUTION
2.0000 mg/kg | INTRAVENOUS | Status: DC
  Filled 2017-03-20: qty 1.16

## 2017-03-20 MED ORDER — TRANEXAMIC ACID 1000 MG/10ML IV SOLN
1.5000 mg/kg/h | INTRAVENOUS | Status: AC
  Administered 2017-03-21: 1.5 mg/kg/h via INTRAVENOUS
  Filled 2017-03-20: qty 25

## 2017-03-20 MED ORDER — TRANEXAMIC ACID (OHS) BOLUS VIA INFUSION
15.0000 mg/kg | INTRAVENOUS | Status: AC
  Administered 2017-03-21: 868.5 mg via INTRAVENOUS
  Filled 2017-03-20: qty 869

## 2017-03-20 MED ORDER — LEVOFLOXACIN IN D5W 500 MG/100ML IV SOLN
500.0000 mg | INTRAVENOUS | Status: AC
  Administered 2017-03-21: 500 mg via INTRAVENOUS
  Filled 2017-03-20: qty 100

## 2017-03-20 MED ORDER — VANCOMYCIN HCL 10 G IV SOLR
1250.0000 mg | INTRAVENOUS | Status: AC
  Administered 2017-03-21: 1250 mg via INTRAVENOUS
  Filled 2017-03-20: qty 1250

## 2017-03-20 MED ORDER — MAGNESIUM SULFATE 50 % IJ SOLN
40.0000 meq | INTRAMUSCULAR | Status: DC
  Filled 2017-03-20: qty 10

## 2017-03-20 MED ORDER — SODIUM CHLORIDE 0.9 % IV SOLN
30.0000 ug/min | INTRAVENOUS | Status: DC
  Filled 2017-03-20: qty 2

## 2017-03-20 NOTE — H&P (Signed)
Jessica [name] [address]       Jessica [name],Jessica [name] [id number]             [phone]      Cardiothoracic Surgery Admission History and Physical   PCP is Jessica Pel, MD Referring Provider is Jessica Hector, MD      Chief Complaint  Patient presents with  . Aortic Stenosis and ascending aortic aneurysm.        HPI:  The patient is a 56 year old woman with a history of bicuspid aortic valve disease that was diagnosed in her early 73's when she was living in [place] and a heart murmur was noted. She moved back to [place] and was followed there for a few years before moving to [place] about 10 years ago. Her echo in 07/2015 showed moderate AS with a mean gradient of 26 mm Hg and a peak velocity ratio of 0.28. Her most recent echo on 12/14/2016 shows progression to severe AS with a mean gradient of 40 mm Hg with a peak velocity ratio of 0.23. Her LVEF is normal at 60-65%. She underwent cath on 12/26/2016 showing widely patent coronary arteries. There was 3+ angiographic AI and normal right heart pressures. She has some enlargement of the aortic root and ascending aorta that has been followed by MRA. The last study on 08/09/2015 showed the sinus of valsalva to be 4.0 cm, STJ 3.6 cm, and ascending aorta 4.4 cm. Her descending aorta was 2.3 cm.She says that she feels fairly well overall. She still walks 2.5 miles daily at a good pace with her dog and has mild shortness of breath and is tired by the end but does not have to stop and does not feel limited. She does report fatigue and occasional dizziness. She has some orthopnea at times.        Past Medical History:  Diagnosis Date  . Aortic stenosis   . Bicuspid aortic valve    Previously seen in [place]; moderate AS and mild AR  . Chronic neck pain   . Congenital heart valve abnormality   . Heart murmur   . Paroxysmal atrial fibrillation (HCC)   . Vertigo          Past Surgical History:  Procedure Laterality  Date  . ENDOMETRIAL ABLATION    . HERNIA REPAIR    . KNEE SURGERY     Multiple  . LAPAROSCOPY     x2  . TUBAL LIGATION           Family History  Problem Relation Age of Onset  . Hypertension Mother   . Heart attack Mother   . Hypertension Father   . Heart attack Father     Social History        Social History  Substance Use Topics  . Smoking status: Current Some Day Smoker    Last attempt to quit: 09/03/2009  . Smokeless tobacco: Never Used     Comment: Quit using Chantix.  Marland Kitchen Alcohol use Yes           Current Outpatient Prescriptions  Medication Sig Dispense Refill  . ibuprofen (ADVIL,MOTRIN) 200 MG tablet Take 400 mg by mouth 2 (two) times daily as needed for mild pain.    Marland Kitchen loratadine (CLARITIN) 10 MG tablet Take 10 mg by mouth daily as needed for allergies.    . Multiple Vitamin (MULTIVITAMIN WITH MINERALS) TABS tablet Take 1 tablet by mouth daily.    Marland Kitchen  sertraline (ZOLOFT) 100 MG tablet Take 100 mg by mouth daily.     No current facility-administered medications for this visit.          Allergies  Allergen Reactions  . Penicillins Hives    Has patient had a PCN reaction causing immediate rash, facial/tongue/throat swelling, SOB or lightheadedness with hypotension: unknown Has patient had a PCN reaction causing severe rash involving mucus membranes or skin necrosis:unknown Has patient had a PCN reaction that required hospitalization no Has patient had a PCN reaction occurring within the last 10 years: no If all of the above answers are "NO", then may proceed with Cephalosporin use.     Review of Systems  Constitutional: Positive for fatigue. Negative for activity change, appetite change, chills, fever and unexpected weight change.  HENT: Negative.        Sees her dentist regularly and has had no problems with teeth.  Eyes: Negative.   Respiratory: Negative.   Cardiovascular: Negative for chest pain, palpitations and  leg swelling.  Gastrointestinal: Negative.   Endocrine: Negative.   Genitourinary: Negative.   Musculoskeletal: Negative.   Skin: Negative.   Allergic/Immunologic: Negative.   Neurological: Positive for dizziness. Negative for syncope.  Hematological: Bruises/bleeds easily.  Psychiatric/Behavioral: The patient is nervous/anxious.     BP 138/85   Pulse 80   Resp 20   Ht 5\' 4"  (1.626 m)   Wt 129 lb (58.5 kg)   SpO2 99% Comment: RA  BMI 22.14 kg/m  Physical Exam  Constitutional: She is oriented to person, place, and time. She appears well-developed and well-nourished. No distress.  HENT:  Head: Normocephalic and atraumatic.  Mouth/Throat: Oropharynx is clear and moist.  Eyes: EOM are normal. Pupils are equal, round, and reactive to light.  Neck: Normal range of motion. Neck supple. No JVD present. No tracheal deviation present. No thyromegaly present.  Cardiovascular: Normal rate, regular rhythm and intact distal pulses.   Murmur heard. 3/6 systolic murmur RSB, no diastolic murmur.  Pulmonary/Chest: Effort normal and breath sounds normal. No respiratory distress. She has no rales.  Abdominal: Soft. Bowel sounds are normal. She exhibits no distension and no mass. There is no tenderness.  Musculoskeletal: Normal range of motion. She exhibits no edema.  Lymphadenopathy:    She has no cervical adenopathy.  Neurological: She is alert and oriented to person, place, and time. She has normal strength. No cranial nerve deficit or sensory deficit.  Skin: Skin is warm and dry.  Psychiatric: She has a normal mood and affect.     Diagnostic Tests:  [facility] [address], [place] [address] [phone]  ------------------------------------------------------------------- Echocardiography  Patient: Jessica, [name] MR #: [id number] Study Date: 12/14/2016 Gender:  F Age: 55 Height: 162.6 cm Weight: 60.2 kg BSA: 1.65 m^2 Pt. Status: Room:  ATTENDING Jessica [name], M.D. ORDERING Jessica [name], M.D. REFERRING Jessica [name], M.D. SONOGRAPHER Cindy Hazy, RDCS PERFORMING [facility], Outpatient  cc:  ------------------------------------------------------------------- LV EF: 60% - 65%  ------------------------------------------------------------------- Indications: I35.9 Aortic Valve Disorder.  ------------------------------------------------------------------- History: PMH: Acquired from the patient and from the patient&'s chart. PMH: Bicuspid Aortic Valve. Dyspnea. Risk factors: Former tobacco use.  ------------------------------------------------------------------- Study Conclusions  - Left ventricle: The cavity size was normal. Systolic function was normal. The estimated ejection fraction was in the range of 60% to 65%. Wall motion was normal; there were no regional wall motion abnormalities. Doppler parameters are consistent with abnormal left ventricular relaxation (grade 1 diastolic dysfunction). - Aortic valve: Bicuspid; severely  thickened, severely calcified leaflets; fusion of the right-left coronary commissure. Valve mobility was restricted. There was severe stenosis. There was mild regurgitation. Peak velocity (S): 389 cm/s. Mean gradient (S): 40 mm Hg. - Aorta: Ascending aortic diameter: 43 mm (S). - Ascending aorta: The ascending aorta was mildly dilated. - Mitral valve: Transvalvular velocity was within the normal range. There was no evidence for stenosis. There was trivial regurgitation. - Right ventricle: The cavity size was normal. Wall thickness was normal. Systolic function was normal. - Atrial septum: No defect or patent foramen ovale was identified. - Tricuspid valve: There was mild-moderate regurgitation. - Pulmonary arteries:  Systolic pressure was within the normal range. PA peak pressure: 30 mm Hg (S).  ------------------------------------------------------------------- Study data: Study status: Routine. Procedure: The patient reported no pain pre or post test. Transthoracic echocardiography for left ventricular function evaluation, for right ventricular function evaluation, and for assessment of valvular function. Image quality was adequate. Study completion: There were no complications. Echocardiography. M-mode, complete 2D, spectral Doppler, and color Doppler. Birthdate: Patient birthdate: 15-Jan-1961. Age: Patient is 56 yr old. Sex: Gender: female. BMI: 22.8 kg/m^2. Blood pressure: 120/76 Patient status: Outpatient. Study date: Study date: 12/14/2016. Study time: 04:03 PM. Location: [facility]  -------------------------------------------------------------------  ------------------------------------------------------------------- Left ventricle: The cavity size was normal. Systolic function was normal. The estimated ejection fraction was in the range of 60% to 65%. Wall motion was normal; there were no regional wall motion abnormalities. Doppler parameters are consistent with abnormal left ventricular relaxation (grade 1 diastolic dysfunction). There was no evidence of elevated ventricular filling pressure by Doppler parameters.  ------------------------------------------------------------------- Aortic valve: Bicuspid; severely thickened, severely calcified leaflets; fusion of the right-left coronary commissure. Valve mobility was restricted. Doppler: There was severe stenosis. There was mild regurgitation. VTI ratio of LVOT to aortic valve: 0.23. Valve area (VTI): 0.86 cm^2. Indexed valve area (VTI): 0.52 cm^2/m^2. Peak velocity ratio of LVOT to aortic valve: 0.23. Valve area (Vmax): 0.87 cm^2. Indexed valve area (Vmax): 0.53 cm^2/m^2. Mean  velocity ratio of LVOT to aortic valve: 0.24. Valve area (Vmean): 0.9 cm^2. Indexed valve area (Vmean): 0.54 cm^2/m^2. Mean gradient (S): 40 mm Hg. Peak gradient (S): 61 mm Hg.  ------------------------------------------------------------------- Aorta: Aortic root: The aortic root was normal in size. Ascending aorta: The ascending aorta was mildly dilated.  ------------------------------------------------------------------- Mitral valve: Structurally normal valve. Mobility was not restricted. Doppler: Transvalvular velocity was within the normal range. There was no evidence for stenosis. There was trivial regurgitation.  ------------------------------------------------------------------- Left atrium: The atrium was normal in size.  ------------------------------------------------------------------- Atrial septum: No defect or patent foramen ovale was identified.  ------------------------------------------------------------------- Right ventricle: The cavity size was normal. Wall thickness was normal. Systolic function was normal.  ------------------------------------------------------------------- Pulmonic valve: Structurally normal valve. Cusp separation was normal. Doppler: Transvalvular velocity was within the normal range. There was no evidence for stenosis. There was no regurgitation.  ------------------------------------------------------------------- Tricuspid valve: Structurally normal valve. Doppler: Transvalvular velocity was within the normal range. There was mild-moderate regurgitation.  ------------------------------------------------------------------- Pulmonary artery: The main pulmonary artery was normal-sized. Systolic pressure was within the normal range.  ------------------------------------------------------------------- Right atrium: The atrium was normal in  size.  ------------------------------------------------------------------- Pericardium: There was no pericardial effusion.  ------------------------------------------------------------------- Systemic veins: Inferior vena cava: The vessel was normal in size. The respirophasic diameter changes were in the normal range (>= 50%), consistent with normal central venous pressure.  ------------------------------------------------------------------- Measurements  Left ventricle Value Reference LV ID, ED, PLAX chordal 48.5 mm 43 - 52 LV ID, ES, PLAX  chordal 28.1 mm 23 - 38 LV fx shortening, PLAX chordal 42 % >=29 LV PW thickness, ED 8.95 mm --------- IVS/LV PW ratio, ED 1.12 <=1.3 Stroke volume, 2D 82 ml --------- Stroke volume/bsa, 2D 50 ml/m^2 --------- LV e&', lateral 8.55 cm/s --------- LV E/e&', lateral 7.85 --------- LV e&', medial 8.22 cm/s --------- LV E/e&', medial 8.16 --------- LV e&', average 8.39 cm/s --------- LV E/e&', average 8 ---------  Ventricular septum Value Reference IVS thickness, ED 9.99 mm ---------  LVOT Value Reference LVOT ID, S 22 mm --------- LVOT area 3.8 cm^2 --------- LVOT ID 22 mm --------- LVOT peak velocity, S 89.5 cm/s --------- LVOT mean velocity, S  71.8 cm/s --------- LVOT VTI, S 21.5 cm --------- LVOT peak gradient, S 3 mm Hg --------- Stroke volume (SV), LVOT DP 81.7 ml --------- Stroke index (SV/bsa), LVOT DP 49.4 ml/m^2 ---------  Aortic valve Value Reference Aortic valve peak velocity, S 389 cm/s --------- Aortic valve mean velocity, S 304 cm/s --------- Aortic valve VTI, S 95.2 cm --------- Aortic mean gradient, S 40 mm Hg --------- Aortic peak gradient, S 61 mm Hg --------- VTI ratio, LVOT/AV 0.23 --------- Aortic valve area, VTI 0.86 cm^2 --------- Aortic valve area/bsa, VTI 0.52 cm^2/m^2 --------- Velocity ratio, peak, LVOT/AV 0.23 --------- Aortic valve area, peak velocity 0.87 cm^2 --------- Aortic valve area/bsa, peak 0.53 cm^2/m^2 --------- velocity Velocity ratio, mean, LVOT/AV 0.24 --------- Aortic valve area, mean velocity 0.9 cm^2 --------- Aortic valve area/bsa, mean 0.54 cm^2/m^2 --------- velocity Aortic regurg pressure half-time 706 ms ---------  Aorta Value Reference Aortic root ID, ED 37 mm --------- Ascending aorta ID, A-P, S 43 mm ---------  Left atrium Value Reference LA ID, A-P, ES 36 mm --------- LA ID/bsa, A-P 2.18 cm/m^2 <=2.2 LA volume, S 57 ml --------- LA  volume/bsa, S 34.5 ml/m^2 --------- LA volume, ES, 1-p A4C 55 ml --------- LA volume/bsa, ES, 1-p A4C 33.2 ml/m^2 --------- LA volume, ES, 1-p A2C 59 ml --------- LA volume/bsa, ES, 1-p A2C 35.7 ml/m^2 ---------  Mitral valve Value Reference Mitral E-wave peak velocity 67.1 cm/s --------- Mitral A-wave peak velocity 107 cm/s --------- Mitral deceleration time 201 ms 150 - 230 Mitral E/A ratio, peak 0.6 ---------  Pulmonary arteries Value Reference PA pressure, S, DP 30 mm Hg <=30  Tricuspid valve Value Reference Tricuspid regurg peak velocity 258 cm/s --------- Tricuspid peak RV-RA gradient 27 mm Hg ---------  Systemic veins Value Reference Estimated CVP 3 mm Hg ---------  Right ventricle Value Reference RV pressure, S, DP 30 mm Hg <=30 RV s&', lateral, S 15.8 cm/s ---------  Legend: (L) and (H) mark values outside specified reference range.  ------------------------------------------------------------------- Prepared and Electronically Authenticated by  Skeet Latch, MD 2018-04-13T23:28:36     Procedures   Right Heart Cath and Coronary Angiography  Conclusion     There is moderate (3+) aortic regurgitation.  1. Widely patent coronary arteries 2. Bicuspid aortic valve with fluoroscopic evidence of heavy calcification and restricted leaflet mobility, known severe aortic stenosis by  noninvasive assessment, and 3+ aortic insufficiency by aortic root angiography 3. Normal right heart hemodynamics   Indications   Severe aortic stenosis [I35.0 (ICD-10-CM)]  Procedural Details/Technique   Technical Details INDICATION: Severe bicuspid aortic valve stenosis  PROCEDURAL DETAILS: There was an indwelling IV in a right antecubital vein. Using normal sterile technique, the IV was changed out for a 5 Fr brachial sheath over a 0.018 inch wire. The right wrist was then prepped, draped, and anesthetized with 1% lidocaine. Using the  modified Seldinger technique a 5/6 French Slender sheath was placed in the right radial artery. Intra-arterial verapamil was administered through the radial artery sheath. IV heparin was administered after a JR4 catheter was advanced into the central aorta. A Swan-Ganz catheter was used for the right heart catheterization. Standard protocol was followed for recording of right heart pressures and sampling of oxygen saturations. Fick cardiac output was calculated. The JR4 catheter is advanced over a versicore wire into the ascending aorta. The catheter was difficult to manipulate. I was able to image the RCA which is a small, nondominant vessel. There was a gradient of resistance trying to remove the JR4 catheter. The catheter was pulled back into the subclavian artery and intra-arterial nitroglycerin and verapamil were administered. The patient was given more sedation. Further doses of verapamil and nitroglycerin were given further down in the arm as well as through the sheath. Ultimately was able to remove the catheter. Attention was then turned to the right groin. A 4 French sheath was inserted via a frontal wall puncture using the modified Seldinger technique. A JL 5 catheter was used to image the left coronary artery. Up a tail catheter was advanced for aortic root angiography. A power injection was done. There were no immediate procedural complications. The patient  was transferred to the post catheterization recovery area for further monitoring.     Estimated blood loss <50 mL.  During this procedure the patient was administered the following to achieve and maintain moderate conscious sedation: Versed 6 mg, Fentanyl 100 mcg, while the patient's heart rate, blood pressure, and oxygen saturation were continuously monitored. The period of conscious sedation was 61 minutes, of which I was present face-to-face 100% of this time.    Coronary Findings   Dominance: Left  Left Anterior Descending  Vessel is angiographically normal.  Left Circumflex  Vessel is angiographically normal.  Right Coronary Artery  Vessel is angiographically normal.  Left Heart   Aortic Valve There is moderate (3+) aortic regurgitation. The aortic valve is calcified. There is restricted aortic valve motion. Aortic root angiography demonstrates 3+ aortic insufficiency. The aortic valve appears bicuspid and is severely calcified with restricted mobility.    Coronary Diagrams   Diagnostic Diagram       Implants        No implant documentation for this case.  PACS Images   Show images for Cardiac catheterization   Link to Procedure Log   Procedure Log    Hemo Data    Most Recent Value  Fick Cardiac Output 4.34 L/min  Fick Cardiac Output Index 2.66 (L/min)/BSA  RA A Wave 5 mmHg  RA V Wave 3 mmHg  RA Mean 2 mmHg  RV Systolic Pressure 22 mmHg  RV Diastolic Pressure 0 mmHg  RV EDP 3 mmHg  PA Systolic Pressure 20 mmHg  PA Diastolic Pressure 8 mmHg  PA Mean 13 mmHg  PW A Wave 8 mmHg  PW V Wave 7 mmHg  PW Mean 6 mmHg  AO Systolic Pressure 86 mmHg  AO Diastolic Pressure 54 mmHg  AO Mean 68 mmHg  QP/QS 1  TPVR Index 4.89 HRUI  TSVR Index 25.54 HRUI  PVR SVR Ratio 0.11  TPVR/TSVR Ratio 0.19    Impression:  The patient is a 56 year old woman with stage D severe, symptomatic aortic stenosis with exertional fatigue and mild shortness of breath  that is not keeping her from normal activities at this time. I have personally reviewed her echo, cath and  MRA studies. She has a bicuspid aortic valve with severely thickened and calcified leaflets with fusion of the right-left commissure. Valve mobility is restricted with severe AS and a mean gradient of 40 mm Hg. She has mild dilation of the ascending aorta to 4.3-4.4 cm and this has been stable since 2011. Her recent echo measured the ascending aorta at 4.3 cm. Cardiac CT shows the ascending aorta to be moderately dilated to 4.6 cm. With a bicuspid aortic valve I think the best treatment is Bentall procedure and replacement of the ascending aorta up to the proximal arch under deep hypothermic circulatory arrest. I reviewed the CT images with her and her husband by conference call. I discussed the surgical procedure in detail. I discussed the options of mechanical and bioprosthetic valves and she says that she does not want to be on Coumadin and wants a bioprosthetic valve. She understands the risk of structural valve deterioration possibly requiring repeat surgery. I discussed the operative procedure with the patient and her husband including alternatives, benefits and risks; including but not limited to bleeding, blood transfusion, infection, stroke, myocardial infarction, graft failure, heart block requiring a permanent pacemaker, organ dysfunction, and death.  Jessica [name] understands and agrees to proceed.     Plan:  Bentall procedure using a bioprosthetic valved graft and replacement of the ascending aortic aneurysm on 03/21/2017.  Jessica Pollack, MD [facility] [phone]

## 2017-03-20 NOTE — Progress Notes (Signed)
Anesthesia Chart Review: Patient is a 56 year old female scheduled for Bentall procedure, resection of ascending aneurysm on 03/21/17 by Dr. Cyndia Bent.  History includes bicuspid AV with severe AS/moderate AR and dilated aortic root/ascending TAA, PAF, vertigo, exertional dyspnea, anxiety, former smoker (quit '11), post-operative N/V, endometrial ablation, hernia repair (not specified).   PCP is Dr. Deland Pretty.  Cardiologist is Dr. Jenkins Rouge.  Meds include Zoloft.  BP 125/73   Pulse 65   Temp 36.8 C   Resp 20   Ht 5\' 4"  (1.626 m)   Wt 127 lb 9.6 oz (57.9 kg)   SpO2 100%   BMI 21.90 kg/m   Cardiac cath 12/26/16:  There is moderate (3+) aortic regurgitation. 1. Widely patent coronary arteries 2. Bicuspid aortic valve with fluoroscopic evidence of heavy calcification and restricted leaflet mobility, known severe aortic stenosis by noninvasive assessment, and 3+ aortic insufficiency by aortic root angiography 3. Normal right heart hemodynamics  Echo 12/14/16: Study Conclusions  - Left ventricle: The cavity size was normal. Systolic function was   normal. The estimated ejection fraction was in the range of 60%   to 65%. Wall motion was normal; there were no regional wall   motion abnormalities. Doppler parameters are consistent with   abnormal left ventricular relaxation (grade 1 diastolic   dysfunction). - Aortic valve: Bicuspid; severely thickened, severely calcified   leaflets; fusion of the right-left coronary commissure. Valve   mobility was restricted. There was severe stenosis. There was   mild regurgitation. Peak velocity (S): 389 cm/s. Mean gradient   (S): 40 mm Hg. - Aorta: Ascending aortic diameter: 43 mm (S). - Ascending aorta: The ascending aorta was mildly dilated. - Mitral valve: Transvalvular velocity was within the normal range.   There was no evidence for stenosis. There was trivial   regurgitation. - Right ventricle: The cavity size was normal. Wall thickness  was   normal. Systolic function was normal. - Atrial septum: No defect or patent foramen ovale was identified. - Tricuspid valve: There was mild-moderate regurgitation. - Pulmonary arteries: Systolic pressure was within the normal   range. PA peak pressure: 30 mm Hg (S).  CT Coronary 01/31/17: IMPRESSION: 1. Aortic atherosclerosis with ascending thoracic aortic aneurysm measuring 4.6 cm in diameter. Ascending thoracic aortic aneurysm. Recommend semi-annual imaging followup by CTA or MRA and referral to cardiothoracic surgery if not already obtained. This recommendation follows 2010 ACCF/AHA/AATS/ACR/ASA/SCA/SCAI/SIR/STS/SVM Guidelines for the Diagnosis and Management of Patients With Thoracic Aortic Disease. Circulation. 2010; 121: I948-N462. 2. Mild diffuse bronchial wall thickening with mild centrilobular and paraseptal emphysema; imaging findings suggestive of underlying COPD. 3. Several tiny 2-4 mm calcified and noncalcified pulmonary nodules scattered throughout the lungs bilaterally. The noncalcified pulmonary nodules are nonspecific, but statistically likely benign. No follow-up needed if patient is low-risk (and has no known or suspected primary neoplasm). Non-contrast chest CT can be considered in 12 months if patient is high-risk. This recommendation follows the consensus statement: Guidelines for Management of Incidental Pulmonary Nodules Detected on CT Images: From the Fleischner Society 2017; Radiology 2017; 284:228-243.  Carotid U/S 03/19/17: Summary: - The vertebral arteries appear patent with antegrade flow. - Findings consistent with a 1- 61 percent stenosis involving the   right internal carotid artery and the left internal carotid   artery.  PFTs 03/19/17: FVC 3.63 (105%), FEV1 2.56 (94%), DLCO unc 18.70 (77%).  Preoperative EKG, CXR, and labs noted.   If no acute changes then I anticipate that she can proceed as planned.  George Hugh [facility]/Anesthesiology Phone [phone] 03/20/2017 9:39 AM

## 2017-03-21 ENCOUNTER — Encounter (HOSPITAL_COMMUNITY)
Admission: RE | Disposition: A | Discharge status: Home / Self Care | Payer: Self-pay | Source: Ambulatory Visit | Attending: Surgery

## 2017-03-21 ENCOUNTER — Inpatient Hospital Stay (HOSPITAL_COMMUNITY): Payer: BLUE CROSS/BLUE SHIELD

## 2017-03-21 ENCOUNTER — Inpatient Hospital Stay (HOSPITAL_COMMUNITY): Payer: BLUE CROSS/BLUE SHIELD | Admitting: Certified Registered Nurse Anesthetist

## 2017-03-21 ENCOUNTER — Inpatient Hospital Stay (HOSPITAL_COMMUNITY): Payer: BLUE CROSS/BLUE SHIELD | Admitting: Vascular Surgery

## 2017-03-21 ENCOUNTER — Encounter (HOSPITAL_COMMUNITY): Payer: Self-pay

## 2017-03-21 ENCOUNTER — Inpatient Hospital Stay (HOSPITAL_COMMUNITY)
Admission: RE | Admit: 2017-03-21 | Discharge: 2017-03-26 | DRG: 220 | Disposition: A | Discharge status: Home / Self Care | Payer: BLUE CROSS/BLUE SHIELD | Source: Ambulatory Visit | Attending: Surgery | Admitting: Surgery

## 2017-03-21 DIAGNOSIS — I97638 Postprocedural hematoma of a circulatory system organ or structure following other circulatory system procedure: Secondary | ICD-10-CM | POA: Diagnosis not present

## 2017-03-21 DIAGNOSIS — R079 Chest pain, unspecified: Secondary | ICD-10-CM | POA: Diagnosis not present

## 2017-03-21 DIAGNOSIS — D6959 Other secondary thrombocytopenia: Secondary | ICD-10-CM | POA: Diagnosis not present

## 2017-03-21 DIAGNOSIS — J90 Pleural effusion, not elsewhere classified: Secondary | ICD-10-CM | POA: Diagnosis not present

## 2017-03-21 DIAGNOSIS — I4891 Unspecified atrial fibrillation: Secondary | ICD-10-CM | POA: Diagnosis not present

## 2017-03-21 DIAGNOSIS — I714 Abdominal aortic aneurysm, without rupture: Secondary | ICD-10-CM | POA: Diagnosis not present

## 2017-03-21 DIAGNOSIS — Y838 Other surgical procedures as the cause of abnormal reaction of the patient, or of later complication, without mention of misadventure at the time of the procedure: Secondary | ICD-10-CM | POA: Diagnosis not present

## 2017-03-21 DIAGNOSIS — E876 Hypokalemia: Secondary | ICD-10-CM | POA: Diagnosis not present

## 2017-03-21 DIAGNOSIS — I352 Nonrheumatic aortic (valve) stenosis with insufficiency: Secondary | ICD-10-CM | POA: Diagnosis not present

## 2017-03-21 DIAGNOSIS — Q231 Congenital insufficiency of aortic valve: Secondary | ICD-10-CM

## 2017-03-21 DIAGNOSIS — R011 Cardiac murmur, unspecified: Secondary | ICD-10-CM | POA: Diagnosis not present

## 2017-03-21 DIAGNOSIS — Z9689 Presence of other specified functional implants: Secondary | ICD-10-CM

## 2017-03-21 DIAGNOSIS — Z952 Presence of prosthetic heart valve: Secondary | ICD-10-CM | POA: Diagnosis not present

## 2017-03-21 DIAGNOSIS — Q2381 Bicuspid aortic valve: Secondary | ICD-10-CM

## 2017-03-21 DIAGNOSIS — I712 Thoracic aortic aneurysm, without rupture: Secondary | ICD-10-CM | POA: Diagnosis not present

## 2017-03-21 DIAGNOSIS — I081 Rheumatic disorders of both mitral and tricuspid valves: Secondary | ICD-10-CM | POA: Diagnosis not present

## 2017-03-21 DIAGNOSIS — R0600 Dyspnea, unspecified: Secondary | ICD-10-CM | POA: Diagnosis not present

## 2017-03-21 DIAGNOSIS — Z8249 Family history of ischemic heart disease and other diseases of the circulatory system: Secondary | ICD-10-CM | POA: Diagnosis not present

## 2017-03-21 DIAGNOSIS — J9811 Atelectasis: Secondary | ICD-10-CM

## 2017-03-21 DIAGNOSIS — Z880 Allergy status to penicillin: Secondary | ICD-10-CM | POA: Diagnosis not present

## 2017-03-21 DIAGNOSIS — I9762 Postprocedural hemorrhage of a circulatory system organ or structure following other procedure: Secondary | ICD-10-CM | POA: Diagnosis not present

## 2017-03-21 DIAGNOSIS — D62 Acute posthemorrhagic anemia: Secondary | ICD-10-CM | POA: Diagnosis not present

## 2017-03-21 DIAGNOSIS — I77810 Thoracic aortic ectasia: Secondary | ICD-10-CM

## 2017-03-21 DIAGNOSIS — I480 Paroxysmal atrial fibrillation: Secondary | ICD-10-CM | POA: Diagnosis present

## 2017-03-21 DIAGNOSIS — E8770 Fluid overload, unspecified: Secondary | ICD-10-CM | POA: Diagnosis not present

## 2017-03-21 DIAGNOSIS — Z4682 Encounter for fitting and adjustment of non-vascular catheter: Secondary | ICD-10-CM | POA: Diagnosis not present

## 2017-03-21 DIAGNOSIS — I350 Nonrheumatic aortic (valve) stenosis: Secondary | ICD-10-CM | POA: Diagnosis not present

## 2017-03-21 DIAGNOSIS — Z452 Encounter for adjustment and management of vascular access device: Secondary | ICD-10-CM | POA: Diagnosis not present

## 2017-03-21 DIAGNOSIS — F17200 Nicotine dependence, unspecified, uncomplicated: Secondary | ICD-10-CM | POA: Diagnosis present

## 2017-03-21 HISTORY — PX: VENTRICULAR ANEURYSM RESECTION: SHX6349

## 2017-03-21 HISTORY — PX: TEE WITHOUT CARDIOVERSION: SHX5443

## 2017-03-21 HISTORY — PX: MEDIASTINAL EXPLORATION: SHX5065

## 2017-03-21 HISTORY — PX: BENTALL PROCEDURE: SHX5058

## 2017-03-21 LAB — POCT I-STAT 3, ART BLOOD GAS (G3+)
ACID-BASE DEFICIT: 2 mmol/L (ref 0.0–2.0)
ACID-BASE EXCESS: 3 mmol/L — AB (ref 0.0–2.0)
Acid-Base Excess: 4 mmol/L — ABNORMAL HIGH (ref 0.0–2.0)
BICARBONATE: 27.3 mmol/L (ref 20.0–28.0)
Bicarbonate: 29.6 mmol/L — ABNORMAL HIGH (ref 20.0–28.0)
Bicarbonate: 24.5 mmol/L (ref 20.0–28.0)
O2 SAT: 100 %
O2 SAT: 100 %
O2 Saturation: 99 %
PCO2 ART: 43.9 mmHg (ref 32.0–48.0)
PH ART: 7.427 (ref 7.350–7.450)
PO2 ART: 439 mmHg — AB (ref 83.0–108.0)
Patient temperature: 34.7
TCO2: 31 mmol/L (ref 0–100)
TCO2: 28 mmol/L (ref 0–100)
TCO2: 26 mmol/L (ref 0–100)
pCO2 arterial: 39 mmHg (ref 32.0–48.0)
pCO2 arterial: 45.2 mmHg (ref 32.0–48.0)
pH, Arterial: 7.453 — ABNORMAL HIGH (ref 7.350–7.450)
pH, Arterial: 7.338 — ABNORMAL LOW (ref 7.350–7.450)
pO2, Arterial: 185 mmHg — ABNORMAL HIGH (ref 83.0–108.0)
pO2, Arterial: 121 mmHg — ABNORMAL HIGH (ref 83.0–108.0)

## 2017-03-21 LAB — CBC
HCT: 26.4 % — ABNORMAL LOW (ref 36.0–46.0)
HCT: 21.3 % — ABNORMAL LOW (ref 36.0–46.0)
HCT: 31 % — ABNORMAL LOW (ref 36.0–46.0)
HEMATOCRIT: 24 % — AB (ref 36.0–46.0)
HEMOGLOBIN: 7.8 g/dL — AB (ref 12.0–15.0)
Hemoglobin: 8.7 g/dL — ABNORMAL LOW (ref 12.0–15.0)
Hemoglobin: 7 g/dL — ABNORMAL LOW (ref 12.0–15.0)
Hemoglobin: 10.2 g/dL — ABNORMAL LOW (ref 12.0–15.0)
MCH: 32.3 pg (ref 26.0–34.0)
MCH: 32.4 pg (ref 26.0–34.0)
MCH: 30.2 pg (ref 26.0–34.0)
MCH: 29.6 pg (ref 26.0–34.0)
MCHC: 33 g/dL (ref 30.0–36.0)
MCHC: 32.9 g/dL (ref 30.0–36.0)
MCHC: 32.5 g/dL (ref 30.0–36.0)
MCHC: 32.9 g/dL (ref 30.0–36.0)
MCV: 98.1 fL (ref 78.0–100.0)
MCV: 98.6 fL (ref 78.0–100.0)
MCV: 93 fL (ref 78.0–100.0)
MCV: 89.9 fL (ref 78.0–100.0)
PLATELETS: 134 10*3/uL — AB (ref 150–400)
PLATELETS: 123 10*3/uL — AB (ref 150–400)
PLATELETS: 95 10*3/uL — AB (ref 150–400)
Platelets: 133 10*3/uL — ABNORMAL LOW (ref 150–400)
RBC: 2.69 MIL/uL — AB (ref 3.87–5.11)
RBC: 2.16 MIL/uL — AB (ref 3.87–5.11)
RBC: 2.58 MIL/uL — AB (ref 3.87–5.11)
RBC: 3.45 MIL/uL — ABNORMAL LOW (ref 3.87–5.11)
RDW: 12.9 % (ref 11.5–15.5)
RDW: 12.9 % (ref 11.5–15.5)
RDW: 16 % — ABNORMAL HIGH (ref 11.5–15.5)
RDW: 16.4 % — AB (ref 11.5–15.5)
WBC: 6.7 10*3/uL (ref 4.0–10.5)
WBC: 8.7 10*3/uL (ref 4.0–10.5)
WBC: 7.8 10*3/uL (ref 4.0–10.5)
WBC: 6.6 10*3/uL (ref 4.0–10.5)

## 2017-03-21 LAB — POCT I-STAT 4, (NA,K, GLUC, HGB,HCT)
GLUCOSE: 100 mg/dL — AB (ref 65–99)
HEMATOCRIT: 30 % — AB (ref 36.0–46.0)
HEMOGLOBIN: 10.2 g/dL — AB (ref 12.0–15.0)
Potassium: 4.2 mmol/L (ref 3.5–5.1)
Sodium: 143 mmol/L (ref 135–145)

## 2017-03-21 LAB — POCT I-STAT, CHEM 8
BUN: 12 mg/dL (ref 6–20)
CHLORIDE: 108 mmol/L (ref 101–111)
CREATININE: 0.4 mg/dL — AB (ref 0.44–1.00)
Calcium, Ion: 1.09 mmol/L — ABNORMAL LOW (ref 1.15–1.40)
Glucose, Bld: 115 mg/dL — ABNORMAL HIGH (ref 65–99)
HEMATOCRIT: 20 % — AB (ref 36.0–46.0)
Hemoglobin: 6.8 g/dL — CL (ref 12.0–15.0)
Potassium: 4.9 mmol/L (ref 3.5–5.1)
Sodium: 142 mmol/L (ref 135–145)
TCO2: 27 mmol/L (ref 0–100)

## 2017-03-21 LAB — PLATELET COUNT: Platelets: 172 10*3/uL (ref 150–400)

## 2017-03-21 LAB — PROTIME-INR
INR: 1.45
INR: 1.25
PROTHROMBIN TIME: 17.8 s — AB (ref 11.4–15.2)
Prothrombin Time: 15.8 seconds — ABNORMAL HIGH (ref 11.4–15.2)

## 2017-03-21 LAB — FIBRINOGEN
FIBRINOGEN: 182 mg/dL — AB (ref 210–475)
FIBRINOGEN: 243 mg/dL (ref 210–475)

## 2017-03-21 LAB — GLUCOSE, CAPILLARY
GLUCOSE-CAPILLARY: 116 mg/dL — AB (ref 65–99)
GLUCOSE-CAPILLARY: 100 mg/dL — AB (ref 65–99)
GLUCOSE-CAPILLARY: 110 mg/dL — AB (ref 65–99)
Glucose-Capillary: 116 mg/dL — ABNORMAL HIGH (ref 65–99)
Glucose-Capillary: 98 mg/dL (ref 65–99)
Glucose-Capillary: 81 mg/dL (ref 65–99)
Glucose-Capillary: 91 mg/dL (ref 65–99)

## 2017-03-21 LAB — HEMOGLOBIN AND HEMATOCRIT, BLOOD
HCT: 29.1 % — ABNORMAL LOW (ref 36.0–46.0)
Hemoglobin: 9.6 g/dL — ABNORMAL LOW (ref 12.0–15.0)

## 2017-03-21 LAB — APTT
aPTT: 36 seconds (ref 24–36)
aPTT: 29 seconds (ref 24–36)

## 2017-03-21 LAB — PREPARE RBC (CROSSMATCH)

## 2017-03-21 SURGERY — EXPLORATION, MEDIASTINUM
Anesthesia: General

## 2017-03-21 SURGERY — BENTALL PROCEDURE
Anesthesia: General | Site: Chest

## 2017-03-21 MED ORDER — MAGNESIUM SULFATE 4 GM/100ML IV SOLN
4.0000 g | Freq: Once | INTRAVENOUS | Status: AC
  Administered 2017-03-21: 4 g via INTRAVENOUS
  Filled 2017-03-21: qty 100

## 2017-03-21 MED ORDER — LACTATED RINGERS IV SOLN
INTRAVENOUS | Status: DC | PRN
  Administered 2017-03-21: 07:00:00 via INTRAVENOUS

## 2017-03-21 MED ORDER — FAMOTIDINE IN NACL 20-0.9 MG/50ML-% IV SOLN
20.0000 mg | Freq: Two times a day (BID) | INTRAVENOUS | Status: AC
  Administered 2017-03-21 (×2): 20 mg via INTRAVENOUS
  Filled 2017-03-21: qty 50

## 2017-03-21 MED ORDER — FENTANYL CITRATE (PF) 250 MCG/5ML IJ SOLN
INTRAMUSCULAR | Status: DC | PRN
  Administered 2017-03-21 (×5): 50 ug via INTRAVENOUS
  Administered 2017-03-21: 200 ug via INTRAVENOUS
  Administered 2017-03-21: 100 ug via INTRAVENOUS
  Administered 2017-03-21 (×2): 50 ug via INTRAVENOUS
  Administered 2017-03-21: 200 ug via INTRAVENOUS
  Administered 2017-03-21: 100 ug via INTRAVENOUS
  Administered 2017-03-21: 50 ug via INTRAVENOUS

## 2017-03-21 MED ORDER — POTASSIUM CHLORIDE 10 MEQ/50ML IV SOLN
10.0000 meq | INTRAVENOUS | Status: AC
  Administered 2017-03-21 (×3): 10 meq via INTRAVENOUS

## 2017-03-21 MED ORDER — THROMBIN 20000 UNITS EX SOLR
CUTANEOUS | Status: DC | PRN
  Administered 2017-03-21: 20000 [IU] via TOPICAL

## 2017-03-21 MED ORDER — FENTANYL CITRATE (PF) 250 MCG/5ML IJ SOLN
INTRAMUSCULAR | Status: DC | PRN
  Administered 2017-03-21: 100 ug via INTRAVENOUS
  Administered 2017-03-21: 50 ug via INTRAVENOUS
  Administered 2017-03-21: 100 ug via INTRAVENOUS

## 2017-03-21 MED ORDER — DEXTROSE 5 % IV SOLN
0.0000 ug/min | INTRAVENOUS | Status: DC
  Filled 2017-03-21: qty 4

## 2017-03-21 MED ORDER — VANCOMYCIN HCL IN DEXTROSE 1-5 GM/200ML-% IV SOLN
1000.0000 mg | Freq: Once | INTRAVENOUS | Status: DC
  Filled 2017-03-21: qty 200

## 2017-03-21 MED ORDER — ONDANSETRON HCL 4 MG/2ML IJ SOLN
4.0000 mg | Freq: Four times a day (QID) | INTRAMUSCULAR | Status: DC | PRN
  Administered 2017-03-22 – 2017-03-24 (×3): 4 mg via INTRAVENOUS
  Filled 2017-03-21 (×3): qty 2

## 2017-03-21 MED ORDER — ACETAMINOPHEN 500 MG PO TABS
1000.0000 mg | ORAL_TABLET | Freq: Four times a day (QID) | ORAL | Status: DC
  Administered 2017-03-22 – 2017-03-26 (×15): 1000 mg via ORAL
  Filled 2017-03-21 (×17): qty 2

## 2017-03-21 MED ORDER — SODIUM CHLORIDE 0.9 % IV SOLN: Freq: Once | INTRAVENOUS | Status: DC

## 2017-03-21 MED ORDER — TRAMADOL HCL 50 MG PO TABS
50.0000 mg | ORAL_TABLET | ORAL | Status: DC | PRN
  Administered 2017-03-22: 50 mg via ORAL
  Administered 2017-03-22 – 2017-03-23 (×3): 100 mg via ORAL
  Administered 2017-03-23 – 2017-03-24 (×4): 50 mg via ORAL
  Administered 2017-03-24 – 2017-03-25 (×3): 100 mg via ORAL
  Filled 2017-03-21: qty 1
  Filled 2017-03-21 (×2): qty 2
  Filled 2017-03-21: qty 1
  Filled 2017-03-21 (×3): qty 2
  Filled 2017-03-21: qty 1
  Filled 2017-03-21 (×2): qty 2

## 2017-03-21 MED ORDER — ALBUMIN HUMAN 5 % IV SOLN
12.5000 g | Freq: Once | INTRAVENOUS | Status: AC
  Administered 2017-03-21: 12.5 g via INTRAVENOUS

## 2017-03-21 MED ORDER — VANCOMYCIN HCL 10 G IV SOLR
1250.0000 mg | INTRAVENOUS | Status: DC
  Filled 2017-03-21: qty 1250

## 2017-03-21 MED ORDER — ROCURONIUM BROMIDE 50 MG/5ML IV SOLN
INTRAVENOUS | Status: AC
  Filled 2017-03-21: qty 1

## 2017-03-21 MED ORDER — GELATIN ABSORBABLE MT POWD
OROMUCOSAL | Status: DC | PRN
  Administered 2017-03-21 (×3): via TOPICAL

## 2017-03-21 MED ORDER — DEXAMETHASONE SODIUM PHOSPHATE 10 MG/ML IJ SOLN
INTRAMUSCULAR | Status: AC
  Filled 2017-03-21: qty 1

## 2017-03-21 MED ORDER — SERTRALINE HCL 100 MG PO TABS
100.0000 mg | ORAL_TABLET | Freq: Every day | ORAL | Status: DC
  Administered 2017-03-23 – 2017-03-26 (×4): 100 mg via ORAL
  Filled 2017-03-21 (×4): qty 1

## 2017-03-21 MED ORDER — ACETAMINOPHEN 160 MG/5ML PO SOLN: 650.0000 mg | Freq: Once | ORAL | Status: AC

## 2017-03-21 MED ORDER — MORPHINE SULFATE (PF) 4 MG/ML IV SOLN
2.0000 mg | INTRAVENOUS | Status: DC | PRN
  Administered 2017-03-22 (×3): 4 mg via INTRAVENOUS
  Filled 2017-03-21 (×3): qty 1

## 2017-03-21 MED ORDER — NOREPINEPHRINE BITARTRATE 1 MG/ML IV SOLN
INTRAVENOUS | Status: DC | PRN
  Administered 2017-03-21: 4 ug/min via INTRAVENOUS

## 2017-03-21 MED ORDER — THROMBIN 20000 UNITS EX SOLR
OROMUCOSAL | Status: DC | PRN
  Administered 2017-03-21 (×2): 4 mL via TOPICAL

## 2017-03-21 MED ORDER — DEXTROSE 5 % IV SOLN
INTRAVENOUS | Status: DC | PRN
  Administered 2017-03-21: 10 ug/min via INTRAVENOUS

## 2017-03-21 MED ORDER — ARTIFICIAL TEARS OPHTHALMIC OINT
TOPICAL_OINTMENT | OPHTHALMIC | Status: DC | PRN
Start: 2017-03-21 — End: 2017-03-21
  Administered 2017-03-21: 1 via OPHTHALMIC

## 2017-03-21 MED ORDER — ONDANSETRON HCL 4 MG/2ML IJ SOLN
INTRAMUSCULAR | Status: AC
  Filled 2017-03-21: qty 2

## 2017-03-21 MED ORDER — SODIUM CHLORIDE 0.9 % IV SOLN
0.0000 ug/kg/h | INTRAVENOUS | Status: DC
  Administered 2017-03-21: 19:00:00 via INTRAVENOUS
  Filled 2017-03-21: qty 2

## 2017-03-21 MED ORDER — SODIUM CHLORIDE 0.9 % IJ SOLN
INTRAMUSCULAR | Status: AC
Start: 2017-03-21 — End: 2017-03-21
  Filled 2017-03-21: qty 20

## 2017-03-21 MED ORDER — 0.9 % SODIUM CHLORIDE (POUR BTL) OPTIME
TOPICAL | Status: DC | PRN
  Administered 2017-03-21: 2000 mL

## 2017-03-21 MED ORDER — SODIUM CHLORIDE 0.9 % IV SOLN
INTRAVENOUS | Status: DC
  Filled 2017-03-21: qty 1

## 2017-03-21 MED ORDER — NITROGLYCERIN IN D5W 200-5 MCG/ML-% IV SOLN
2.0000 ug/min | INTRAVENOUS | Status: DC
  Filled 2017-03-21: qty 250

## 2017-03-21 MED ORDER — BISACODYL 10 MG RE SUPP: 10.0000 mg | Freq: Every day | RECTAL | Status: DC

## 2017-03-21 MED ORDER — VECURONIUM BROMIDE 10 MG IV SOLR
INTRAVENOUS | Status: DC | PRN
  Administered 2017-03-21: 2 mg via INTRAVENOUS
  Administered 2017-03-21: 5 mg via INTRAVENOUS
  Administered 2017-03-21: 3 mg via INTRAVENOUS

## 2017-03-21 MED ORDER — ARTIFICIAL TEARS OPHTHALMIC OINT
TOPICAL_OINTMENT | OPHTHALMIC | Status: AC
  Filled 2017-03-21: qty 3.5

## 2017-03-21 MED ORDER — MIDAZOLAM HCL 2 MG/2ML IJ SOLN
2.0000 mg | INTRAMUSCULAR | Status: DC | PRN
  Administered 2017-03-21 (×2): 2 mg via INTRAVENOUS
  Filled 2017-03-21 (×2): qty 2

## 2017-03-21 MED ORDER — CHLORHEXIDINE GLUCONATE 0.12 % MT SOLN
15.0000 mL | OROMUCOSAL | Status: AC
  Administered 2017-03-21: 15 mL via OROMUCOSAL

## 2017-03-21 MED ORDER — 0.9 % SODIUM CHLORIDE (POUR BTL) OPTIME
TOPICAL | Status: DC | PRN
  Administered 2017-03-21: 6000 mL

## 2017-03-21 MED ORDER — ACETAMINOPHEN 160 MG/5ML PO SOLN
1000.0000 mg | Freq: Four times a day (QID) | ORAL | Status: DC
  Administered 2017-03-21: 1000 mg
  Filled 2017-03-21: qty 40.6

## 2017-03-21 MED ORDER — ETOMIDATE 2 MG/ML IV SOLN
INTRAVENOUS | Status: DC | PRN
  Administered 2017-03-21: 20 mg via INTRAVENOUS

## 2017-03-21 MED ORDER — BISACODYL 5 MG PO TBEC
10.0000 mg | DELAYED_RELEASE_TABLET | Freq: Every day | ORAL | Status: DC
  Administered 2017-03-22 – 2017-03-25 (×4): 10 mg via ORAL
  Filled 2017-03-21 (×5): qty 2

## 2017-03-21 MED ORDER — FENTANYL CITRATE (PF) 250 MCG/5ML IJ SOLN
INTRAMUSCULAR | Status: AC
  Filled 2017-03-21: qty 5

## 2017-03-21 MED ORDER — PHENYLEPHRINE HCL 10 MG/ML IJ SOLN
30.0000 ug/min | INTRAMUSCULAR | Status: DC
  Filled 2017-03-21: qty 2

## 2017-03-21 MED ORDER — SODIUM CHLORIDE 0.9 % IV SOLN: INTRAVENOUS | Status: DC

## 2017-03-21 MED ORDER — HEPARIN SODIUM (PORCINE) 1000 UNIT/ML IJ SOLN
INTRAMUSCULAR | Status: AC
  Filled 2017-03-21: qty 1

## 2017-03-21 MED ORDER — INSULIN ASPART 100 UNIT/ML ~~LOC~~ SOLN: 0.0000 [IU] | SUBCUTANEOUS | Status: DC

## 2017-03-21 MED ORDER — SODIUM CHLORIDE 0.9 % IV SOLN
INTRAVENOUS | Status: DC | PRN
  Administered 2017-03-21: 1.1 [IU]/h via INTRAVENOUS

## 2017-03-21 MED ORDER — DOPAMINE-DEXTROSE 3.2-5 MG/ML-% IV SOLN
0.0000 ug/kg/min | INTRAVENOUS | Status: DC
  Filled 2017-03-21: qty 250

## 2017-03-21 MED ORDER — ORAL CARE MOUTH RINSE
15.0000 mL | Freq: Four times a day (QID) | OROMUCOSAL | Status: DC
  Administered 2017-03-22 (×2): 15 mL via OROMUCOSAL

## 2017-03-21 MED ORDER — MORPHINE SULFATE (PF) 2 MG/ML IV SOLN: 1.0000 mg | INTRAVENOUS | Status: DC | PRN

## 2017-03-21 MED ORDER — METOPROLOL TARTRATE 25 MG/10 ML ORAL SUSPENSION
12.5000 mg | Freq: Two times a day (BID) | ORAL | Status: DC
  Administered 2017-03-21: 12.5 mg
  Filled 2017-03-21: qty 5

## 2017-03-21 MED ORDER — ROCURONIUM BROMIDE 100 MG/10ML IV SOLN
INTRAVENOUS | Status: DC | PRN
  Administered 2017-03-21: 70 mg via INTRAVENOUS
  Administered 2017-03-21: 30 mg via INTRAVENOUS

## 2017-03-21 MED ORDER — DEXMEDETOMIDINE HCL IN NACL 200 MCG/50ML IV SOLN
INTRAVENOUS | Status: AC
  Filled 2017-03-21: qty 50

## 2017-03-21 MED ORDER — NITROGLYCERIN IN D5W 200-5 MCG/ML-% IV SOLN: 0.0000 ug/min | INTRAVENOUS | Status: DC

## 2017-03-21 MED ORDER — INSULIN REGULAR BOLUS VIA INFUSION
0.0000 [IU] | Freq: Three times a day (TID) | INTRAVENOUS | Status: DC
  Filled 2017-03-21: qty 10

## 2017-03-21 MED ORDER — PROTAMINE SULFATE 10 MG/ML IV SOLN
INTRAVENOUS | Status: DC | PRN
  Administered 2017-03-21: 180 mg via INTRAVENOUS
  Administered 2017-03-21: 20 mg via INTRAVENOUS

## 2017-03-21 MED ORDER — PANTOPRAZOLE SODIUM 40 MG PO TBEC
40.0000 mg | DELAYED_RELEASE_TABLET | Freq: Every day | ORAL | Status: DC
  Administered 2017-03-22 – 2017-03-26 (×5): 40 mg via ORAL
  Filled 2017-03-21 (×5): qty 1

## 2017-03-21 MED ORDER — SODIUM CHLORIDE 0.9 % IV SOLN
Freq: Once | INTRAVENOUS | Status: AC
  Administered 2017-03-21: 18:00:00 via INTRAVENOUS

## 2017-03-21 MED ORDER — CHLORHEXIDINE GLUCONATE 0.12% ORAL RINSE (MEDLINE KIT)
15.0000 mL | Freq: Two times a day (BID) | OROMUCOSAL | Status: DC
  Administered 2017-03-21 – 2017-03-22 (×2): 15 mL via OROMUCOSAL

## 2017-03-21 MED ORDER — LACTATED RINGERS IV SOLN: INTRAVENOUS | Status: DC

## 2017-03-21 MED ORDER — ROCURONIUM BROMIDE 50 MG/5ML IV SOLN
INTRAVENOUS | Status: AC
  Filled 2017-03-21: qty 2

## 2017-03-21 MED ORDER — METOPROLOL TARTRATE 12.5 MG HALF TABLET
12.5000 mg | ORAL_TABLET | Freq: Two times a day (BID) | ORAL | Status: DC
  Administered 2017-03-23 – 2017-03-24 (×2): 12.5 mg via ORAL
  Filled 2017-03-21 (×3): qty 1

## 2017-03-21 MED ORDER — DEXAMETHASONE SODIUM PHOSPHATE 10 MG/ML IJ SOLN
INTRAMUSCULAR | Status: DC | PRN
  Administered 2017-03-21: 10 mg via INTRAVENOUS

## 2017-03-21 MED ORDER — LACTATED RINGERS IV SOLN
INTRAVENOUS | Status: DC | PRN
  Administered 2017-03-21: 19:00:00 via INTRAVENOUS

## 2017-03-21 MED ORDER — SODIUM CHLORIDE 0.9 % IV SOLN
0.0000 ug/min | INTRAVENOUS | Status: DC
  Administered 2017-03-22: 20 ug/min via INTRAVENOUS
  Filled 2017-03-21 (×2): qty 2

## 2017-03-21 MED ORDER — SODIUM CHLORIDE 0.9 % IV SOLN
250.0000 mL | INTRAVENOUS | Status: DC
  Administered 2017-03-22: 250 mL via INTRAVENOUS

## 2017-03-21 MED ORDER — THROMBIN 20000 UNITS EX SOLR
CUTANEOUS | Status: AC
  Filled 2017-03-21: qty 20000

## 2017-03-21 MED ORDER — ASPIRIN 81 MG PO CHEW: 324.0000 mg | CHEWABLE_TABLET | Freq: Every day | ORAL | Status: DC

## 2017-03-21 MED ORDER — PLASMA-LYTE 148 IV SOLN
INTRAVENOUS | Status: DC
  Filled 2017-03-21: qty 2.5

## 2017-03-21 MED ORDER — TRANEXAMIC ACID 1000 MG/10ML IV SOLN
1.5000 mg/kg/h | INTRAVENOUS | Status: AC
  Administered 2017-03-21: 1.5 mg/kg/h via INTRAVENOUS
  Filled 2017-03-21: qty 25

## 2017-03-21 MED ORDER — HEMOSTATIC AGENTS (NO CHARGE) OPTIME
TOPICAL | Status: DC | PRN
  Administered 2017-03-21: 1 via TOPICAL

## 2017-03-21 MED ORDER — PROPOFOL 10 MG/ML IV BOLUS
INTRAVENOUS | Status: DC | PRN
  Administered 2017-03-21: 120 mg via INTRAVENOUS

## 2017-03-21 MED ORDER — PHENYLEPHRINE HCL 10 MG/ML IJ SOLN
INTRAVENOUS | Status: DC | PRN
  Administered 2017-03-21: 40 ug/min via INTRAVENOUS

## 2017-03-21 MED ORDER — POTASSIUM CHLORIDE 2 MEQ/ML IV SOLN
80.0000 meq | INTRAVENOUS | Status: DC
  Filled 2017-03-21: qty 40

## 2017-03-21 MED ORDER — EPHEDRINE 5 MG/ML INJ
INTRAVENOUS | Status: AC
  Filled 2017-03-21: qty 10

## 2017-03-21 MED ORDER — MORPHINE SULFATE (PF) 4 MG/ML IV SOLN: 1.0000 mg | INTRAVENOUS | Status: AC | PRN

## 2017-03-21 MED ORDER — DOCUSATE SODIUM 100 MG PO CAPS
200.0000 mg | ORAL_CAPSULE | Freq: Every day | ORAL | Status: DC
  Administered 2017-03-22 – 2017-03-26 (×5): 200 mg via ORAL
  Filled 2017-03-21 (×5): qty 2

## 2017-03-21 MED ORDER — HEPARIN SODIUM (PORCINE) 1000 UNIT/ML IJ SOLN
INTRAMUSCULAR | Status: DC | PRN
  Administered 2017-03-21: 20000 [IU] via INTRAVENOUS

## 2017-03-21 MED ORDER — ROCURONIUM BROMIDE 10 MG/ML (PF) SYRINGE
PREFILLED_SYRINGE | INTRAVENOUS | Status: DC | PRN
  Administered 2017-03-21 (×2): 50 mg via INTRAVENOUS

## 2017-03-21 MED ORDER — PHENYLEPHRINE HCL 10 MG/ML IJ SOLN
INTRAMUSCULAR | Status: DC | PRN
  Administered 2017-03-21 (×3): 80 ug via INTRAVENOUS

## 2017-03-21 MED ORDER — LEVOFLOXACIN IN D5W 500 MG/100ML IV SOLN
500.0000 mg | INTRAVENOUS | Status: DC
  Filled 2017-03-21: qty 100

## 2017-03-21 MED ORDER — FENTANYL CITRATE (PF) 250 MCG/5ML IJ SOLN
INTRAMUSCULAR | Status: AC
  Filled 2017-03-21: qty 25

## 2017-03-21 MED ORDER — MIDAZOLAM HCL 5 MG/5ML IJ SOLN
INTRAMUSCULAR | Status: DC | PRN
  Administered 2017-03-21: 1 mg via INTRAVENOUS
  Administered 2017-03-21 (×2): 0.5 mg via INTRAVENOUS
  Administered 2017-03-21: 1 mg via INTRAVENOUS
  Administered 2017-03-21 (×2): 3 mg via INTRAVENOUS
  Administered 2017-03-21: 1 mg via INTRAVENOUS

## 2017-03-21 MED ORDER — ACETAMINOPHEN 650 MG RE SUPP
650.0000 mg | Freq: Once | RECTAL | Status: AC
  Administered 2017-03-21: 650 mg via RECTAL

## 2017-03-21 MED ORDER — HYDROCORTISONE NA SUCCINATE PF 100 MG IJ SOLR
INTRAMUSCULAR | Status: DC | PRN
  Administered 2017-03-21: 125 mg via INTRAVENOUS

## 2017-03-21 MED ORDER — PROPOFOL 10 MG/ML IV BOLUS
INTRAVENOUS | Status: AC
  Filled 2017-03-21: qty 20

## 2017-03-21 MED ORDER — NITROGLYCERIN 0.2 MG/ML ON CALL CATH LAB
INTRAVENOUS | Status: DC | PRN
  Administered 2017-03-21: 20 ug via INTRAVENOUS

## 2017-03-21 MED ORDER — NOREPINEPHRINE BITARTRATE 1 MG/ML IV SOLN
0.0000 ug/min | INTRAVENOUS | Status: DC
  Filled 2017-03-21: qty 4

## 2017-03-21 MED ORDER — LACTATED RINGERS IV SOLN: INTRAVENOUS | Status: DC | PRN

## 2017-03-21 MED ORDER — MIDAZOLAM HCL 10 MG/2ML IJ SOLN
INTRAMUSCULAR | Status: AC
  Filled 2017-03-21: qty 2

## 2017-03-21 MED ORDER — ETOMIDATE 2 MG/ML IV SOLN
INTRAVENOUS | Status: AC
  Filled 2017-03-21: qty 10

## 2017-03-21 MED ORDER — SODIUM CHLORIDE 0.9 % IV SOLN
INTRAVENOUS | Status: DC | PRN
  Administered 2017-03-21: 19:00:00 via INTRAVENOUS

## 2017-03-21 MED ORDER — DEXMEDETOMIDINE HCL IN NACL 400 MCG/100ML IV SOLN
0.1000 ug/kg/h | INTRAVENOUS | Status: DC
  Filled 2017-03-21: qty 100

## 2017-03-21 MED ORDER — MIDAZOLAM HCL 5 MG/5ML IJ SOLN
INTRAMUSCULAR | Status: DC | PRN
  Administered 2017-03-21 (×3): 2 mg via INTRAVENOUS

## 2017-03-21 MED ORDER — DEXTROSE 50 % IV SOLN
INTRAVENOUS | Status: AC
  Filled 2017-03-21: qty 50

## 2017-03-21 MED ORDER — OXYCODONE HCL 5 MG PO TABS
5.0000 mg | ORAL_TABLET | ORAL | Status: DC | PRN
  Administered 2017-03-22: 10 mg via ORAL
  Administered 2017-03-23 – 2017-03-24 (×6): 5 mg via ORAL
  Filled 2017-03-21 (×2): qty 1
  Filled 2017-03-21: qty 2
  Filled 2017-03-21 (×4): qty 1

## 2017-03-21 MED ORDER — DEXTROSE 50 % IV SOLN
INTRAVENOUS | Status: DC | PRN
  Administered 2017-03-21: 16 mL via INTRAVENOUS

## 2017-03-21 MED ORDER — PHENYLEPHRINE 40 MCG/ML (10ML) SYRINGE FOR IV PUSH (FOR BLOOD PRESSURE SUPPORT)
PREFILLED_SYRINGE | INTRAVENOUS | Status: AC
  Filled 2017-03-21: qty 10

## 2017-03-21 MED ORDER — TRANEXAMIC ACID (OHS) BOLUS VIA INFUSION
15.0000 mg/kg | INTRAVENOUS | Status: DC
  Filled 2017-03-21: qty 869

## 2017-03-21 MED ORDER — LACTATED RINGERS IV SOLN
INTRAVENOUS | Status: DC | PRN
  Administered 2017-03-21: 08:00:00 via INTRAVENOUS

## 2017-03-21 MED ORDER — METOPROLOL TARTRATE 5 MG/5ML IV SOLN: 2.5000 mg | INTRAVENOUS | Status: DC | PRN

## 2017-03-21 MED ORDER — TRANEXAMIC ACID (OHS) PUMP PRIME SOLUTION
2.0000 mg/kg | INTRAVENOUS | Status: DC
  Filled 2017-03-21: qty 1.16

## 2017-03-21 MED ORDER — LACTATED RINGERS IV SOLN: 500.0000 mL | Freq: Once | INTRAVENOUS | Status: DC | PRN

## 2017-03-21 MED ORDER — LEVOFLOXACIN IN D5W 750 MG/150ML IV SOLN
750.0000 mg | INTRAVENOUS | Status: AC
  Administered 2017-03-22: 750 mg via INTRAVENOUS
  Filled 2017-03-21: qty 150

## 2017-03-21 MED ORDER — SODIUM CHLORIDE 0.9 % IV SOLN
INTRAVENOUS | Status: DC
  Filled 2017-03-21: qty 30

## 2017-03-21 MED ORDER — ASPIRIN EC 325 MG PO TBEC
325.0000 mg | DELAYED_RELEASE_TABLET | Freq: Every day | ORAL | Status: DC
  Administered 2017-03-22 – 2017-03-26 (×5): 325 mg via ORAL
  Filled 2017-03-21 (×5): qty 1

## 2017-03-21 MED ORDER — DEXTROSE 5 % IV SOLN
750.0000 mg | INTRAVENOUS | Status: DC
  Filled 2017-03-21: qty 750

## 2017-03-21 MED ORDER — MORPHINE SULFATE (PF) 2 MG/ML IV SOLN: 2.0000 mg | INTRAVENOUS | Status: DC | PRN

## 2017-03-21 MED ORDER — CHLORHEXIDINE GLUCONATE 4 % EX LIQD: 30.0000 mL | CUTANEOUS | Status: DC

## 2017-03-21 MED ORDER — SODIUM CHLORIDE 0.9% FLUSH
3.0000 mL | INTRAVENOUS | Status: DC | PRN
  Administered 2017-03-22: 3 mL via INTRAVENOUS
  Filled 2017-03-21: qty 3

## 2017-03-21 MED ORDER — MAGNESIUM SULFATE 50 % IJ SOLN
40.0000 meq | INTRAMUSCULAR | Status: DC
  Filled 2017-03-21: qty 10

## 2017-03-21 MED ORDER — DEXTROSE 5 % IV SOLN
1.5000 g | INTRAVENOUS | Status: DC
  Filled 2017-03-21: qty 1.5

## 2017-03-21 MED ORDER — SODIUM CHLORIDE 0.9% FLUSH
3.0000 mL | Freq: Two times a day (BID) | INTRAVENOUS | Status: DC
  Administered 2017-03-22 – 2017-03-25 (×6): 3 mL via INTRAVENOUS

## 2017-03-21 MED ORDER — SODIUM CHLORIDE 0.45 % IV SOLN
INTRAVENOUS | Status: DC | PRN
  Administered 2017-03-21: 14:00:00 via INTRAVENOUS

## 2017-03-21 MED ORDER — ALBUMIN HUMAN 5 % IV SOLN
INTRAVENOUS | Status: DC | PRN
  Administered 2017-03-21 (×2): via INTRAVENOUS

## 2017-03-21 MED ORDER — ALBUMIN HUMAN 5 % IV SOLN
INTRAVENOUS | Status: AC
  Administered 2017-03-21: 12.5 g
  Filled 2017-03-21: qty 250

## 2017-03-21 MED ORDER — HYDROCORTISONE NA SUCCINATE PF 250 MG IJ SOLR
INTRAMUSCULAR | Status: AC
  Filled 2017-03-21: qty 250

## 2017-03-21 SURGICAL SUPPLY — 91 items
ADAPTER CARDIO PERF ANTE/RETRO (ADAPTER) ×4 IMPLANT
APPLICATOR TIP COSEAL (VASCULAR PRODUCTS) ×4 IMPLANT
BAG DECANTER FOR FLEXI CONT (MISCELLANEOUS) IMPLANT
BLADE STERNUM SYSTEM 6 (BLADE) ×4 IMPLANT
BLADE SURG 15 STRL LF DISP TIS (BLADE) ×2 IMPLANT
BLADE SURG 15 STRL SS (BLADE) ×2
CANISTER SUCT 3000ML PPV (MISCELLANEOUS) ×4 IMPLANT
CANNULA GUNDRY RCSP 15FR (MISCELLANEOUS) ×8 IMPLANT
CATH ROBINSON RED A/P 18FR (CATHETERS) ×16 IMPLANT
CATH THORACIC 36FR (CATHETERS) ×4 IMPLANT
CATH THORACIC 36FR RT ANG (CATHETERS) ×4 IMPLANT
CAUTERY HIGH TEMP VAS (MISCELLANEOUS) ×4 IMPLANT
CAUTERY SURG HI TEMP FINE TIP (MISCELLANEOUS) ×4 IMPLANT
CONT SPEC 4OZ CLIKSEAL STRL BL (MISCELLANEOUS) ×8 IMPLANT
COVER SURGICAL LIGHT HANDLE (MISCELLANEOUS) IMPLANT
CRADLE DONUT ADULT HEAD (MISCELLANEOUS) ×4 IMPLANT
DRAPE SLUSH/WARMER DISC (DRAPES) ×4 IMPLANT
DRSG COVADERM 4X14 (GAUZE/BANDAGES/DRESSINGS) ×4 IMPLANT
ELECT CAUTERY BLADE 6.4 (BLADE) ×4 IMPLANT
ELECT REM PT RETURN 9FT ADLT (ELECTROSURGICAL) ×8
ELECTRODE REM PT RTRN 9FT ADLT (ELECTROSURGICAL) ×4 IMPLANT
FELT TEFLON 1X6 (MISCELLANEOUS) ×4 IMPLANT
GAUZE SPONGE 4X4 12PLY STRL (GAUZE/BANDAGES/DRESSINGS) ×4 IMPLANT
GLOVE BIO SURGEON STRL SZ 6.5 (GLOVE) ×9 IMPLANT
GLOVE BIO SURGEON STRL SZ7 (GLOVE) ×4 IMPLANT
GLOVE BIO SURGEON STRL SZ7.5 (GLOVE) ×8 IMPLANT
GLOVE BIO SURGEONS STRL SZ 6.5 (GLOVE) ×3
GLOVE EUDERMIC 7 POWDERFREE (GLOVE) ×8 IMPLANT
GOWN STRL REUS W/ TWL LRG LVL3 (GOWN DISPOSABLE) ×12 IMPLANT
GOWN STRL REUS W/ TWL XL LVL3 (GOWN DISPOSABLE) ×4 IMPLANT
GOWN STRL REUS W/TWL LRG LVL3 (GOWN DISPOSABLE) ×12
GOWN STRL REUS W/TWL XL LVL3 (GOWN DISPOSABLE) ×4
GRAFT GELWEAVE VALSALVA 26CM (Prosthesis & Implant Heart) ×4 IMPLANT
GRAFT HEMASHIELD 28X40 (Vascular Products) ×4 IMPLANT
HEART VENT LT CURVED (MISCELLANEOUS) ×4 IMPLANT
HEMOSTAT POWDER SURGIFOAM 1G (HEMOSTASIS) ×12 IMPLANT
HEMOSTAT SURGICEL 2X14 (HEMOSTASIS) ×4 IMPLANT
KIT BASIN OR (CUSTOM PROCEDURE TRAY) ×4 IMPLANT
KIT CATH CPB BARTLE (MISCELLANEOUS) ×4 IMPLANT
KIT ROOM TURNOVER OR (KITS) ×4 IMPLANT
KIT SUCTION CATH 14FR (SUCTIONS) ×4 IMPLANT
LINE VENT (MISCELLANEOUS) ×4 IMPLANT
LOOP VESSEL SUPERMAXI WHITE (MISCELLANEOUS) ×4 IMPLANT
NS IRRIG 1000ML POUR BTL (IV SOLUTION) ×24 IMPLANT
PACK OPEN HEART (CUSTOM PROCEDURE TRAY) ×4 IMPLANT
PAD ARMBOARD 7.5X6 YLW CONV (MISCELLANEOUS) ×8 IMPLANT
SEALANT SURG COSEAL 8ML (VASCULAR PRODUCTS) ×4 IMPLANT
SET CARDIOPLEGIA MPS 5001102 (MISCELLANEOUS) ×4 IMPLANT
SET VEIN GRAFT PERF (SET/KITS/TRAYS/PACK) ×4 IMPLANT
SUT BONE WAX W31G (SUTURE) ×4 IMPLANT
SUT ETHIBON 2 0 V 52N 30 (SUTURE) ×8 IMPLANT
SUT ETHIBON EXCEL 2-0 V-5 (SUTURE) IMPLANT
SUT ETHIBOND 2 0 SH (SUTURE) ×8
SUT ETHIBOND 2 0 SH 36X2 (SUTURE) ×8 IMPLANT
SUT ETHIBOND V-5 VALVE (SUTURE) IMPLANT
SUT PROLENE 3 0 SH 1 (SUTURE) ×4 IMPLANT
SUT PROLENE 3 0 SH 48 (SUTURE) ×4 IMPLANT
SUT PROLENE 3 0 SH DA (SUTURE) ×4 IMPLANT
SUT PROLENE 4 0 RB 1 (SUTURE) ×12
SUT PROLENE 4-0 RB1 .5 CRCL 36 (SUTURE) ×12 IMPLANT
SUT PROLENE 5 0 C 1 36 (SUTURE) ×20 IMPLANT
SUT PROLENE 5 0 RB 2 (SUTURE) ×8 IMPLANT
SUT PROLENE 6 0 C 1 30 (SUTURE) ×8 IMPLANT
SUT SILK  1 MH (SUTURE) ×6
SUT SILK 1 MH (SUTURE) ×6 IMPLANT
SUT SILK 1 TIES 10X30 (SUTURE) ×4 IMPLANT
SUT SILK 2 0 SH CR/8 (SUTURE) ×12 IMPLANT
SUT SILK 2 0 TIES 10X30 (SUTURE) ×4 IMPLANT
SUT SILK 2 0 TIES 17X18 (SUTURE) ×2
SUT SILK 2-0 18XBRD TIE BLK (SUTURE) ×2 IMPLANT
SUT SILK 3 0 SH CR/8 (SUTURE) ×4 IMPLANT
SUT SILK 4 0 TIE 10X30 (SUTURE) ×8 IMPLANT
SUT STEEL 6MS V (SUTURE) ×8 IMPLANT
SUT STEEL STERNAL CCS#1 18IN (SUTURE) IMPLANT
SUT STEEL SZ 6 DBL 3X14 BALL (SUTURE) ×4 IMPLANT
SUT TEM PAC WIRE 2 0 SH (SUTURE) ×16 IMPLANT
SUT VIC AB 1 CTX 27 (SUTURE) ×8 IMPLANT
SUT VIC AB 1 CTX 36 (SUTURE) ×4
SUT VIC AB 1 CTX36XBRD ANBCTR (SUTURE) ×4 IMPLANT
SUT VIC AB 2-0 CT1 27 (SUTURE)
SUT VIC AB 2-0 CT1 TAPERPNT 27 (SUTURE) IMPLANT
SUT VIC AB 3-0 X1 27 (SUTURE) ×8 IMPLANT
SYSTEM SAHARA CHEST DRAIN ATS (WOUND CARE) ×4 IMPLANT
TAPE CLOTH SURG 4X10 WHT LF (GAUZE/BANDAGES/DRESSINGS) ×4 IMPLANT
TAPE PAPER 2X10 WHT MICROPORE (GAUZE/BANDAGES/DRESSINGS) ×4 IMPLANT
TOWEL GREEN STERILE FF (TOWEL DISPOSABLE) ×8 IMPLANT
TRAY FOLEY SILVER 14FR TEMP (SET/KITS/TRAYS/PACK) ×4 IMPLANT
UNDERPAD 30X30 (UNDERPADS AND DIAPERS) ×4 IMPLANT
VALVE MAGNA EASE AORTIC 23MM (Prosthesis & Implant Heart) ×4 IMPLANT
WATER STERILE IRR 1000ML POUR (IV SOLUTION) ×8 IMPLANT
YANKAUER SUCT BULB TIP NO VENT (SUCTIONS) ×4 IMPLANT

## 2017-03-21 SURGICAL SUPPLY — 57 items
BLADE SURG 10 STRL SS (BLADE) IMPLANT
CANISTER SUCT 3000ML PPV (MISCELLANEOUS) ×3 IMPLANT
CATH THORACIC 28FR (CATHETERS) IMPLANT
CLIP TI MEDIUM 24 (CLIP) ×3 IMPLANT
CLIP TI WIDE RED SMALL 24 (CLIP) ×6 IMPLANT
CONT SPEC 4OZ CLIKSEAL STRL BL (MISCELLANEOUS) IMPLANT
COVER SURGICAL LIGHT HANDLE (MISCELLANEOUS) IMPLANT
DRAPE CARDIOVASCULAR INCISE (DRAPES) ×2
DRAPE LAPAROTOMY T 102X78X121 (DRAPES) IMPLANT
DRAPE PROXIMA HALF (DRAPES) ×6 IMPLANT
DRAPE SRG 135X102X78XABS (DRAPES) ×1 IMPLANT
DRSG COVADERM 4X14 (GAUZE/BANDAGES/DRESSINGS) ×3 IMPLANT
ELECT CAUTERY BLADE 6.4 (BLADE) IMPLANT
ELECT REM PT RETURN 9FT ADLT (ELECTROSURGICAL) ×3
ELECTRODE REM PT RTRN 9FT ADLT (ELECTROSURGICAL) ×1 IMPLANT
FELT TEFLON 1X6 (MISCELLANEOUS) ×3 IMPLANT
GAUZE SPONGE 4X4 12PLY STRL (GAUZE/BANDAGES/DRESSINGS) ×3 IMPLANT
GAUZE SPONGE 4X4 16PLY XRAY LF (GAUZE/BANDAGES/DRESSINGS) IMPLANT
GLOVE EUDERMIC 7 POWDERFREE (GLOVE) ×6 IMPLANT
GOWN STRL REUS W/ TWL LRG LVL3 (GOWN DISPOSABLE) ×3 IMPLANT
GOWN STRL REUS W/ TWL XL LVL3 (GOWN DISPOSABLE) ×1 IMPLANT
GOWN STRL REUS W/TWL LRG LVL3 (GOWN DISPOSABLE) ×6
GOWN STRL REUS W/TWL XL LVL3 (GOWN DISPOSABLE) ×2
HEMOSTAT POWDER SURGIFOAM 1G (HEMOSTASIS) ×6 IMPLANT
HEMOSTAT SURGICEL 2X14 (HEMOSTASIS) IMPLANT
KIT BASIN OR (CUSTOM PROCEDURE TRAY) ×6 IMPLANT
KIT ROOM TURNOVER OR (KITS) ×3 IMPLANT
KIT SUCTION CATH 14FR (SUCTIONS) ×3 IMPLANT
NS IRRIG 1000ML POUR BTL (IV SOLUTION) ×6 IMPLANT
PACK CHEST (CUSTOM PROCEDURE TRAY) ×3 IMPLANT
PACK SURGICAL SETUP 50X90 (CUSTOM PROCEDURE TRAY) IMPLANT
PAD ARMBOARD 7.5X6 YLW CONV (MISCELLANEOUS) ×6 IMPLANT
PENCIL BUTTON HOLSTER BLD 10FT (ELECTRODE) IMPLANT
SPONGE INTESTINAL PEANUT (DISPOSABLE) IMPLANT
SUT ETHIBOND 2 0 SH (SUTURE) ×2
SUT ETHIBOND 2 0 SH 36X2 (SUTURE) ×1 IMPLANT
SUT PROLENE 3 0 SH DA (SUTURE) ×3 IMPLANT
SUT SILK 2 0 SH CR/8 (SUTURE) ×3 IMPLANT
SUT SILK 2 0 TIES 10X30 (SUTURE) IMPLANT
SUT SILK 3 0 SH CR/8 (SUTURE) ×3 IMPLANT
SUT STEEL 6MS V (SUTURE) ×6 IMPLANT
SUT VIC AB 1 CTX 36 (SUTURE) ×6
SUT VIC AB 1 CTX36XBRD ANBCTR (SUTURE) ×3 IMPLANT
SUT VIC AB 2-0 CTX 27 (SUTURE) ×3 IMPLANT
SUT VIC AB 2-0 CTX 36 (SUTURE) ×3 IMPLANT
SUT VIC AB 3-0 SH 27 (SUTURE)
SUT VIC AB 3-0 SH 27X BRD (SUTURE) IMPLANT
SUT VIC AB 3-0 X1 27 (SUTURE) ×6 IMPLANT
SYR 10ML LL (SYRINGE) IMPLANT
SYR 5ML LUER SLIP (SYRINGE) ×3 IMPLANT
SYSTEM SAHARA CHEST DRAIN RE-I (WOUND CARE) ×3 IMPLANT
TOWEL GREEN STERILE FF (TOWEL DISPOSABLE) ×9 IMPLANT
TOWEL OR 17X24 6PK STRL BLUE (TOWEL DISPOSABLE) ×3 IMPLANT
TOWEL OR 17X26 10 PK STRL BLUE (TOWEL DISPOSABLE) ×3 IMPLANT
TUBE CONNECTING 12'X1/4 (SUCTIONS)
TUBE CONNECTING 12X1/4 (SUCTIONS) IMPLANT
WATER STERILE IRR 1000ML POUR (IV SOLUTION) ×3 IMPLANT

## 2017-03-21 NOTE — Interval H&P Note (Signed)
History and Physical Interval Note:  03/21/2017 6:58 AM  Jessica [name]  has presented today for surgery, with the diagnosis of BISCUSPID AV AS AFIB AORTIC ROOT DILATION  The various methods of treatment have been discussed with the patient and family. After consideration of risks, benefits and other options for treatment, the patient has consented to  Procedure(s) with comments: BENTALL PROCEDURE (N/A) - CIRC ARREST  LEFT RADIAL A-LINE RESECTION OF ASCENDING ANEURYSM (N/A) TRANSESOPHAGEAL ECHOCARDIOGRAM (TEE) (N/A) as a surgical intervention .  The patient's history has been reviewed, patient examined, no change in status, stable for surgery.  I have reviewed the patient's chart and labs.  Questions were answered to the patient's satisfaction.     Gaye Pollack

## 2017-03-21 NOTE — Anesthesia Procedure Notes (Signed)
Central Venous Catheter Insertion Performed by: Belinda Block, anesthesiologist Start/End7/19/2018 6:45 AM, 03/21/2017 7:00 AM Patient location: Pre-op. Preanesthetic checklist: patient identified, IV checked, site marked, risks and benefits discussed, surgical consent, monitors and equipment checked, pre-op evaluation and timeout performed Position: Trendelenburg Lidocaine 1% used for infiltration Hand hygiene performed , maximum sterile barriers used  and Seldinger technique used Catheter size: 8.5 Fr Central line was placed.Sheath introducer Swan type:thermodilution Ultrasound Notes:anatomy identified, no ultrasound evidence of intravascular and/or intraneural injection and image(s) printed for medical record Attempts: 1 Following insertion, line sutured. Post procedure assessment: blood return through all ports  Patient tolerated the procedure well with no immediate complications.

## 2017-03-21 NOTE — Anesthesia Preprocedure Evaluation (Signed)
Anesthesia Evaluation  Patient identified by MRN, date of birth, ID band Patient awake    Reviewed: Allergy & Precautions, NPO status , Patient's Chart, lab work & pertinent test results  History of Anesthesia Complications (+) PONV and history of anesthetic complications  Airway Mallampati: II  TM Distance: >3 FB Neck ROM: Full    Dental  (+) Teeth Intact, Dental Advisory Given   Pulmonary former smoker,    breath sounds clear to auscultation       Cardiovascular + dysrhythmias Atrial Fibrillation + Valvular Problems/Murmurs AS  Rhythm:Regular Rate:Normal     Neuro/Psych Anxiety negative neurological ROS     GI/Hepatic negative GI ROS, Neg liver ROS,   Endo/Other  negative endocrine ROS  Renal/GU negative Renal ROS     Musculoskeletal negative musculoskeletal ROS (+)   Abdominal   Peds  Hematology negative hematology ROS (+)   Anesthesia Other Findings Day of surgery medications reviewed with the patient.  Reproductive/Obstetrics                             Lab Results  Component Value Date   WBC 8.7 03/21/2017   HGB 7.0 (L) 03/21/2017   HCT 21.3 (L) 03/21/2017   MCV 98.6 03/21/2017   PLT 133 (L) 03/21/2017   Lab Results  Component Value Date   CREATININE 0.68 03/19/2017   BUN 16 03/19/2017   NA 135 03/19/2017   K 3.9 03/19/2017   CL 104 03/19/2017   CO2 18 (L) 03/19/2017   Lab Results  Component Value Date   INR 1.45 03/21/2017   INR 1.00 03/19/2017   INR 1.0 12/18/2016   EKG: Sinus Bradycardia, PVC's  Echo: - Left ventricle: The cavity size was normal. Systolic function was   normal. The estimated ejection fraction was in the range of 60%   to 65%. Wall motion was normal; there were no regional wall   motion abnormalities. Doppler parameters are consistent with   abnormal left ventricular relaxation (grade 1 diastolic   dysfunction). - Aortic valve: Bicuspid;  severely thickened, severely calcified   leaflets; fusion of the right-left coronary commissure. Valve   mobility was restricted. There was severe stenosis. There was   mild regurgitation. Peak velocity (S): 389 cm/s. Mean gradient   (S): 40 mm Hg. - Aorta: Ascending aortic diameter: 43 mm (S). - Ascending aorta: The ascending aorta was mildly dilated. - Mitral valve: Transvalvular velocity was within the normal range.   There was no evidence for stenosis. There was trivial   regurgitation. - Right ventricle: The cavity size was normal. Wall thickness was   normal. Systolic function was normal. - Atrial septum: No defect or patent foramen ovale was identified. - Tricuspid valve: There was mild-moderate regurgitation. - Pulmonary arteries: Systolic pressure was within the normal   range. PA peak pressure: 30 mm Hg (S).  Anesthesia Physical  Anesthesia Plan  ASA: IV  Anesthesia Plan: General   Post-op Pain Management:    Induction: Intravenous  PONV Risk Score and Plan: 4 or greater and Ondansetron, Dexamethasone, Propofol, Midazolam and Treatment may vary due to age or medical condition  Airway Management Planned: Oral ETT  Additional Equipment: Arterial line, CVP, PA Cath, TEE and Ultrasound Guidance Line Placement  Intra-op Plan:   Post-operative Plan: Post-operative intubation/ventilation  Informed Consent: I have reviewed the patients History and Physical, chart, labs and discussed the procedure including the risks, benefits and alternatives for the  proposed anesthesia with the patient or authorized representative who has indicated his/her understanding and acceptance.   Dental advisory given  Plan Discussed with: CRNA  Anesthesia Plan Comments:         Anesthesia Quick Evaluation

## 2017-03-21 NOTE — Transfer of Care (Signed)
Immediate Anesthesia Transfer of Care Note  Patient: Nadalyn Deringer Natarajan  Procedure(s) Performed: Procedure(s) with comments: BENTALL PROCEDURE (N/A) - CIRC ARREST  LEFT RADIAL A-LINE RESECTION OF ASCENDING ANEURYSM (N/A) TRANSESOPHAGEAL ECHOCARDIOGRAM (TEE) (N/A)  Patient Location: SICU  Anesthesia Type:General  Level of Consciousness: sedated and Patient remains intubated per anesthesia plan  Airway & Oxygen Therapy: Patient remains intubated per anesthesia plan and Patient placed on Ventilator (see vital sign flow sheet for setting)  Post-op Assessment: Report given to RN and Post -op Vital signs reviewed and stable  Post vital signs: Reviewed and stable  Last Vitals:  Vitals:   03/21/17 0552  BP: 107/67  Pulse: 60  Resp: 18  Temp: 36.7 C    Last Pain:  Vitals:   03/21/17 0552  TempSrc: Oral      Patients Stated Pain Goal: 0 (88/87/57 9728)  Complications: No apparent anesthesia complications

## 2017-03-21 NOTE — Brief Op Note (Signed)
03/21/2017  12:16 PM  PATIENT:  Jessica [name]  56 y.o. female  PRE-OPERATIVE DIAGNOSIS:  BISCUSPID AV AS AFIB AORTIC ROOT DILATION  POST-OPERATIVE DIAGNOSIS:  BISCUSPID AV AS  PROCEDURE:  Procedure(s) with comments: BENTALL PROCEDURE (N/A) - CIRC ARREST  LEFT RADIAL A-LINE RESECTION OF ASCENDING ANEURYSM (N/A) TRANSESOPHAGEAL ECHOCARDIOGRAM (TEE) (N/A)  SURGEON:  Surgeon(s) and Role:    * Bartle, Fernande Boyden, MD - Primary  PHYSICIAN ASSISTANT:  Nicholes Rough, PA-C   ANESTHESIA:   general  EBL:  Total I/O In: 1800 [I.V.:1800] Out: 655 [Urine:655]  BLOOD ADMINISTERED:none  DRAINS: routine   LOCAL MEDICATIONS USED:  NONE  SPECIMEN:  Source of Specimen:  aortic valve leaflets, portion of the aorta  DISPOSITION OF SPECIMEN:  PATHOLOGY  COUNTS:  YES  TOURNIQUET:  * No tourniquets in log *  DICTATION: .Dragon Dictation  PLAN OF CARE: Admit to inpatient   PATIENT DISPOSITION:  ICU - intubated and hemodynamically stable.   Delay start of Pharmacological VTE agent (>24hrs) due to surgical blood loss or risk of bleeding: yes

## 2017-03-21 NOTE — Anesthesia Postprocedure Evaluation (Signed)
Anesthesia Post Note  Patient: Jessica [name]  Procedure(s) Performed: Procedure(s) (LRB): MEDIASTINAL EXPLORATION FOR BLEEDING (N/A)     Patient location during evaluation: SICU Anesthesia Type: General Level of consciousness: patient remains intubated per anesthesia plan Pain management: pain level controlled Vital Signs Assessment: post-procedure vital signs reviewed and stable Respiratory status: patient on ventilator - see flowsheet for VS Cardiovascular status: blood pressure returned to baseline and stable Anesthetic complications: no    Last Vitals:  Vitals:   03/21/17 1815 03/21/17 1830  BP:    Pulse: 86 86  Resp: 13 19  Temp:      Last Pain:  Vitals:   03/21/17 1800  TempSrc: Core (Comment)                 Chella Chapdelaine DAVID

## 2017-03-21 NOTE — Transfer of Care (Signed)
Immediate Anesthesia Transfer of Care Note  Patient: Jessica [name]  Procedure(s) Performed: Procedure(s): MEDIASTINAL EXPLORATION FOR BLEEDING (N/A)  Patient Location: SICU  Anesthesia Type:General  Level of Consciousness: Patient remains intubated per anesthesia plan  Airway & Oxygen Therapy: Patient remains intubated per anesthesia plan and Patient placed on Ventilator (see vital sign flow sheet for setting)  Post-op Assessment: Report given to RN and Post -op Vital signs reviewed and stable  Post vital signs: Reviewed and stable  Last Vitals:  Vitals:   03/21/17 1815 03/21/17 1830  BP:    Pulse: 86 86  Resp: 13 19  Temp:      Last Pain:  Vitals:   03/21/17 1800  TempSrc: Core (Comment)      Patients Stated Pain Goal: 0 (12/81/18 8677)  Complications: No apparent anesthesia complications

## 2017-03-21 NOTE — Progress Notes (Signed)
  Echocardiogram Echocardiogram Transesophageal has been performed.  Jessica [name] L Androw 03/21/2017, 8:29 AM

## 2017-03-21 NOTE — Significant Event (Signed)
Patient transported to OR at 1836pm. Family have been updated by surgeon and staff.    Jessica [name]

## 2017-03-21 NOTE — Op Note (Signed)
CARDIOVASCULAR SURGERY OPERATIVE NOTE  03/21/2017  Surgeon:  Gaye Pollack, MD  First Assistant: Nicholes Rough,  PA-C   Preoperative Diagnosis:  Severe bicuspid aortic valve stenosis with ascending aortic aneurysm   Postoperative Diagnosis:  Same   Procedure:  1. Median Sternotomy 2. Extracorporeal circulation 3.   Replacement of the ascending aorta (hemi-arch) using a 28 mm Hemashield graft under deep hypothermic circulatory arrest 4.   Biological Bentall Procedure using a 23 mm Edwards Magna-Ease pericardial valve and a 26 mm Gelweave Valsalva graft.  Anesthesia:  General Endotracheal   Clinical History/Surgical Indication:  The patient is a 56 year old woman with a history of bicuspid aortic valve disease that was diagnosed in her early 74's when she was living in [place] and a heart murmur was noted. She moved back to [place] and was followed there for a few years before moving to [place] about 10 years ago. Her echo in 07/2015 showed moderate AS with a mean gradient of 26 mm Hg and a peak velocity ratio of 0.28. Her most recent echo on 12/14/2016 shows progression to severe AS with a mean gradient of 40 mm Hg with a peak velocity ratio of 0.23. Her LVEF is normal at 60-65%. She underwent cath on 12/26/2016 showing widely patent coronary arteries. There was 3+ angiographic AI and normal right heart pressures. She has some enlargement of the aortic root and ascending aorta that has been followed by MRA. The last study on 08/09/2015 showed the sinus of valsalva to be 4.0 cm, STJ 3.6 cm, and ascending aorta 4.4 cm. Her descending aorta was 2.3 cm.She says that she feels fairly well overall. She still walks 2.5 miles daily at a good pace with her dog and has mild shortness of breath and is tired by the end but does not have to stop and does not feel limited. She does report fatigue and occasional dizziness. She has some orthopnea at times.   She has stage D severe, symptomatic aortic  stenosis with exertional fatigue and mild shortness of breath that is not keeping her from normal activities at this time. I have personally reviewed her echo, cath and MRA studies. She has a bicuspid aortic valve with severely thickened and calcified leaflets with fusion of the right-left commissure. Valve mobility is restricted with severe AS and a mean gradient of 40 mm Hg. She has mild dilation of the ascending aorta to 4.3-4.4 cm and this has been stable since 2011. Her recent echo measured the ascending aorta at 4.3 cm. Cardiac CT shows the ascending aorta to be moderately dilated to 4.6 cm. With a bicuspid aortic valve I think the best treatment is Bentall procedure and replacement of the ascending aorta up to the proximal arch under deep hypothermic circulatory arrest. I reviewed the CT images with her and her husband by conference call. I discussed the surgical procedure in detail. I discussed the options of mechanical and bioprosthetic valves and she says that she does not want to be on Coumadin and wants a bioprosthetic valve. She understands the risk of structural valve deterioration possibly requiring repeat surgery. I discussed the operative procedure with the patient and her husbandincluding alternatives, benefits and risks; including but not limited to bleeding, blood transfusion, infection, stroke, myocardial infarction, graft failure, heart block requiring a permanent pacemaker, organ dysfunction, and death. Amberlyn Martinezgarcia Zoretichunderstands and agrees to proceed.     Preparation:  The patient was seen in the preoperative holding area and the correct patient, correct  operation were confirmed with the patient after reviewing the medical record and catheterization. The consent was signed by me. Preoperative antibiotics were given. A pulmonary arterial line and radial arterial line were placed by the anesthesia team. The patient was taken back to the operating room and positioned supine on the  operating room table. After being placed under general endotracheal anesthesia by the anesthesia team a foley catheter was placed. The neck, chest, abdomen, and both legs were prepped with betadine soap and solution and draped in the usual sterile manner. A surgical time-out was taken and the correct patient and operative procedure were confirmed with the nursing and anesthesia staff.  TEE:  Performed by Dr. Suella Broad. This showed a markedly calcified bicuspid aortic valve with severe stenosis. Normal LV function.   Cardiopulmonary Bypass:  A median sternotomy was performed. The pericardium was opened in the midline. Right ventricular function appeared normal. The ascending aorta was of normal size and had no palpable plaque. There were no contraindications to aortic cannulation or cross-clamping. The patient was fully systemically heparinized and the ACT was maintained > 400 sec. The distal ascending aorta was cannulated with a 20 F aortic cannula for arterial inflow. Venous cannulation was performed via the right atrial appendage using a two-staged venous cannula. Systemic cooling to 18 degrees Centigrade and topical cooling of the heart with iced saline were used. Hyperkalemic retrograde cold blood cardioplegia was used to induce diastolic arrest and was then given at about 20 minute intervals throughout the period of arrest to maintain myocardial temperature at or below 10 degrees centigrade. A temperature probe was inserted into the interventricular septum and an insulating pad was placed in the pericardium. CO2 was insufflated into the pericardium throughout the case to minimize intracardiac air.    Resection and grafting of ascending aortic aneurysm:  The patient was placed on cardiopulmonary bypass and a left ventricular vent was placed via the right superior pulmonary vein. Systemic cooling was begun with a goal temperature of 18 degrees centigrade by bladder and rectal temperature probes. A  retrograde cardioplegia cannula was placed through the right atrium into the coronary sinus without difficulty. A retrograde cerebral perfusion cannula was placed into the SVC through a pursestring suture and the SVC was encircled with a silastic tape.   After 30 minutes of cooling the target temperature of 18 degrees centigrade was reached. Cerebral oximetry was 70% bilaterally. BIS was zero. The patient was given Propofol and 125 mg of Solumedrol. The head was packed in ice. The bed was placed in steep trendelenburg. Circulatory arrest was begun and the blood volume emptied into the venous reservoir. Continuous retrograde cerebraplegia was begun and the SVC occluded with the silastic tape. Cold blood retrograde cardioplegia was given and myocardial temperature dropped to 10 degrees centigrade. Additional doses were given at approximately 20 minute intervals throughout the period of circulatory arrest and cross-clamping. Complete diastolic arrest was maintained. The aortic cannula was removed. The aorta was transected just proximal to the innominate artery beveling the resection out along the undersurface of the aortic arch (Hemiarch replacement). The aortic diameter was measured at 28 mm here. A 28 x 10 mm Hemasheild Platinum vascular graft was prepared. It was anastomosed to the aortic arch in an end to end manner using 3-0 prolene continuous suture with a felt strip to reinforce the anastomisis. A light coating of CoSeal was applied to seal needle holes. The arterial end of the bypass circuit was then connected to the 23mm side  arm graft and circulation was slowly resumed. The tape was removed from the SVC. The aortic graft was cross-clamped proximal to the side arm graft and full CPB support was resumed. Circulatory arrest time was 19 minutes. Retrograde cerebral perfusion time was 14 minutes.   Bentall Procedure:   The ascending aorta was mobilized from the right pulmonary artery and main PA. It was  opened longitudinally and the valve inspected. It was a bicuspid valve with fusion of the left and right cusps as well as partial fusion of the right and non-coronary cusps with 3 commissures. The valve was very calcified and thickened with severe stenosis. The right and left coronary arteries were removed from the aortic root with a button of aortic wall around the ostia.  They were retracted carefully out of the way with stay sutures to prevent rotation. The native valve was excised taking care to remove all particulate debri. The annulus was decalcified with rongeurs. The annulus was sized and a 23 mm Edwards Magna-Ease pericardial valve was chosen. ( Model # [id number], Serial # [id number]). A series of pledgetted 2-0 Ethibond horizontal mattress sutures were placed around the annulus with the pledgets in a sub-annular position. A 30 mm Gelweave Valsalva graft was chosen ( Ref # [id number] ADP,  SN [id number]). The proximal cuff was trimmed so that there were 3 rings remaining. The valve was placed in the graft so that the sewing cuff was adjacent to the proximal graft cuff. The commissural posts were lined up with the vertical marker sutures on the graft and the valve and graft were held in position using a 5-0 prolene suture at each commissure. The sutures were placed through the valve sewing ring followed by the proximal graft cuff. The valve was lowered into place and the sutures tied . The valve seated nicely.  Small openings were made in the graft for the coronary anastomoses using a thermal cautery. Then the left and right coronary buttons were anastomosed to the graft in an end to side manner using continuous 5-0 prolene suture. A light coating of CoSeal was applied to each anastomosis for hemostasis. The two grafts were then cut to the appropriate length and anastomosed end to end using continuous 3-0 prolene suture. CoSeal was applied to seal the needle holes in the grafts. A vent cannula was placed into the  graft to remove any air. Deairing maneuvers were performed and the bed placed in trendelenburg position.   Completion:   The patient was rewarmed to 37 degrees Centigrade. The crossclamp was removed with a time of 124 minutes. There was spontaneous return of sinus rhythm. The position of the grafts was satisfactory. The vascular anastomoses all appeared hemostatic. Two temporary epicardial pacing wires were placed on the right atrium and two on the right ventricle. The patient was weaned from CPB without difficulty on renal dopamine at 2 mcg. CPB time was 187 minutes. Cardiac output was 5 LPM. TEE showed a normal functioning aortic valve prosthesis with no AI and no MR.  LV function appeared normal. Heparin was fully reversed with protamine and the aortic and venous cannulas removed. Hemostasis was achieved. Mediastinal drainage tubes were placed. The sternum was closed with  #6 stainless steel wires. The fascia was closed with continuous # 1 vicryl suture. The subcutaneous tissue was closed with 2-0 vicryl continuous suture. The skin was closed with 3-0 vicryl subcuticular suture. All sponge, needle, and instrument counts were reported correct at the end of the case. Dry sterile dressings were  placed over the incisions and around the chest tubes which were connected to pleurevac suction. The patient was then transported to the surgical intensive care unit in critical but stable condition.

## 2017-03-21 NOTE — Anesthesia Procedure Notes (Signed)
Procedure Name: Intubation Date/Time: 03/21/2017 7:49 AM Performed by: Mervyn Gay Pre-anesthesia Checklist: Patient identified, Patient being monitored, Timeout performed, Emergency Drugs available and Suction available Patient Re-evaluated:Patient Re-evaluated prior to induction Oxygen Delivery Method: Circle System Utilized Preoxygenation: Pre-oxygenation with 100% oxygen Induction Type: IV induction Ventilation: Mask ventilation without difficulty Laryngoscope Size: 3 and Mac Grade View: Grade I Tube type: Oral Tube size: 7.5 mm Number of attempts: 1 Airway Equipment and Method: Stylet Placement Confirmation: ETT inserted through vocal cords under direct vision,  positive ETCO2 and breath sounds checked- equal and bilateral Secured at: 21 cm Tube secured with: Tape Dental Injury: Teeth and Oropharynx as per pre-operative assessment

## 2017-03-21 NOTE — Anesthesia Procedure Notes (Signed)
Arterial Line Insertion Start/End7/19/2018 7:20 AM, 03/21/2017 7:25 AM Performed by: Suella Broad D, anesthesiologist  Patient location: Pre-op. Preanesthetic checklist: patient identified, IV checked, site marked, risks and benefits discussed, surgical consent, monitors and equipment checked, pre-op evaluation, timeout performed and anesthesia consent Lidocaine 1% used for infiltration Right, radial was placed Catheter size: 20 Fr Hand hygiene performed  and maximum sterile barriers used   Attempts: 1 Procedure performed without using ultrasound guided technique. Following insertion, dressing applied. Post procedure assessment: normal and unchanged

## 2017-03-21 NOTE — Anesthesia Preprocedure Evaluation (Addendum)
Anesthesia Evaluation  Patient identified by MRN, date of birth, ID band Patient awake    Reviewed: Allergy & Precautions, NPO status , Patient's Chart, lab work & pertinent test results  History of Anesthesia Complications (+) PONV and history of anesthetic complications  Airway Mallampati: II  TM Distance: >3 FB Neck ROM: Full    Dental  (+) Teeth Intact, Dental Advisory Given   Pulmonary former smoker,    breath sounds clear to auscultation       Cardiovascular + dysrhythmias Atrial Fibrillation + Valvular Problems/Murmurs AS  Rhythm:Regular Rate:Normal     Neuro/Psych Anxiety negative neurological ROS     GI/Hepatic negative GI ROS, Neg liver ROS,   Endo/Other  negative endocrine ROS  Renal/GU negative Renal ROS     Musculoskeletal negative musculoskeletal ROS (+)   Abdominal   Peds  Hematology negative hematology ROS (+)   Anesthesia Other Findings Day of surgery medications reviewed with the patient.  Reproductive/Obstetrics                            Lab Results  Component Value Date   WBC 4.5 03/19/2017   HGB 13.6 03/19/2017   HCT 41.6 03/19/2017   MCV 98.3 03/19/2017   PLT 279 03/19/2017   Lab Results  Component Value Date   CREATININE 0.68 03/19/2017   BUN 16 03/19/2017   NA 135 03/19/2017   K 3.9 03/19/2017   CL 104 03/19/2017   CO2 18 (L) 03/19/2017   Lab Results  Component Value Date   INR 1.00 03/19/2017   INR 1.0 12/18/2016   EKG: Sinus Bradycardia, PVC's  Echo: - Left ventricle: The cavity size was normal. Systolic function was   normal. The estimated ejection fraction was in the range of 60%   to 65%. Wall motion was normal; there were no regional wall   motion abnormalities. Doppler parameters are consistent with   abnormal left ventricular relaxation (grade 1 diastolic   dysfunction). - Aortic valve: Bicuspid; severely thickened, severely calcified   leaflets; fusion of the right-left coronary commissure. Valve   mobility was restricted. There was severe stenosis. There was   mild regurgitation. Peak velocity (S): 389 cm/s. Mean gradient   (S): 40 mm Hg. - Aorta: Ascending aortic diameter: 43 mm (S). - Ascending aorta: The ascending aorta was mildly dilated. - Mitral valve: Transvalvular velocity was within the normal range.   There was no evidence for stenosis. There was trivial   regurgitation. - Right ventricle: The cavity size was normal. Wall thickness was   normal. Systolic function was normal. - Atrial septum: No defect or patent foramen ovale was identified. - Tricuspid valve: There was mild-moderate regurgitation. - Pulmonary arteries: Systolic pressure was within the normal   range. PA peak pressure: 30 mm Hg (S).  Anesthesia Physical Anesthesia Plan  ASA: IV  Anesthesia Plan: General   Post-op Pain Management:    Induction: Intravenous  PONV Risk Score and Plan: 4 or greater and Ondansetron, Dexamethasone, Propofol, Midazolam and Treatment may vary due to age or medical condition  Airway Management Planned: Oral ETT  Additional Equipment: Arterial line, CVP, PA Cath, TEE and Ultrasound Guidance Line Placement  Intra-op Plan:   Post-operative Plan: Post-operative intubation/ventilation  Informed Consent: I have reviewed the patients History and Physical, chart, labs and discussed the procedure including the risks, benefits and alternatives for the proposed anesthesia with the patient or authorized representative who has indicated  his/her understanding and acceptance.   Dental advisory given  Plan Discussed with: CRNA  Anesthesia Plan Comments:         Anesthesia Quick Evaluation

## 2017-03-21 NOTE — Significant Event (Addendum)
Chest tube output increasingly high. Dr. Cyndia Bent notified and has been at bedside.  A unit of pRBC have been given so far, and albumins have been given prior to that. Levophed started, for blood pressures support. Chest tubes output total 1150cc since admitted to unit at 1355pm. Family at bedside who have been updated by nursing and surgeon.   Jessica [name]

## 2017-03-22 ENCOUNTER — Inpatient Hospital Stay (HOSPITAL_COMMUNITY): Payer: BLUE CROSS/BLUE SHIELD

## 2017-03-22 ENCOUNTER — Encounter (HOSPITAL_COMMUNITY): Payer: Self-pay | Admitting: Surgery

## 2017-03-22 LAB — POCT I-STAT 4, (NA,K, GLUC, HGB,HCT)
Glucose, Bld: 101 mg/dL — ABNORMAL HIGH (ref 65–99)
HCT: 26 % — ABNORMAL LOW (ref 36.0–46.0)
Hemoglobin: 8.8 g/dL — ABNORMAL LOW (ref 12.0–15.0)
Potassium: 3.5 mmol/L (ref 3.5–5.1)
Sodium: 143 mmol/L (ref 135–145)

## 2017-03-22 LAB — PREPARE PLATELET PHERESIS: Unit division: 0

## 2017-03-22 LAB — PREPARE FRESH FROZEN PLASMA
UNIT DIVISION: 0
Unit division: 0

## 2017-03-22 LAB — BASIC METABOLIC PANEL
ANION GAP: 5 (ref 5–15)
BUN: 10 mg/dL (ref 6–20)
CALCIUM: 8.2 mg/dL — AB (ref 8.9–10.3)
CO2: 23 mmol/L (ref 22–32)
Chloride: 112 mmol/L — ABNORMAL HIGH (ref 101–111)
Creatinine, Ser: 0.69 mg/dL (ref 0.44–1.00)
GFR calc non Af Amer: >60 mL/min (ref >60)
GLUCOSE: 121 mg/dL — AB (ref 65–99)
POTASSIUM: 4.3 mmol/L (ref 3.5–5.1)
SODIUM: 140 mmol/L (ref 135–145)

## 2017-03-22 LAB — CBC
HCT: 30 % — ABNORMAL LOW (ref 36.0–46.0)
HEMATOCRIT: 31.4 % — AB (ref 36.0–46.0)
Hemoglobin: 10 g/dL — ABNORMAL LOW (ref 12.0–15.0)
Hemoglobin: 10.1 g/dL — ABNORMAL LOW (ref 12.0–15.0)
MCH: 29.8 pg (ref 26.0–34.0)
MCH: 29.3 pg (ref 26.0–34.0)
MCHC: 33.3 g/dL (ref 30.0–36.0)
MCHC: 32.2 g/dL (ref 30.0–36.0)
MCV: 89.3 fL (ref 78.0–100.0)
MCV: 91 fL (ref 78.0–100.0)
PLATELETS: 98 10*3/uL — AB (ref 150–400)
PLATELETS: 99 10*3/uL — AB (ref 150–400)
RBC: 3.36 MIL/uL — AB (ref 3.87–5.11)
RBC: 3.45 MIL/uL — ABNORMAL LOW (ref 3.87–5.11)
RDW: 17.4 % — AB (ref 11.5–15.5)
RDW: 18 % — AB (ref 11.5–15.5)
WBC: 8.6 10*3/uL (ref 4.0–10.5)
WBC: 9.4 10*3/uL (ref 4.0–10.5)

## 2017-03-22 LAB — PREPARE CRYOPRECIPITATE
UNIT DIVISION: 0
Unit division: 0
Unit division: 0

## 2017-03-22 LAB — POCT I-STAT 3, ART BLOOD GAS (G3+)
ACID-BASE DEFICIT: 3 mmol/L — AB (ref 0.0–2.0)
Bicarbonate: 22.6 mmol/L (ref 20.0–28.0)
Bicarbonate: 25.1 mmol/L (ref 20.0–28.0)
O2 SAT: 97 %
O2 SAT: 99 %
PCO2 ART: 39.6 mmHg (ref 32.0–48.0)
PCO2 ART: 42.8 mmHg (ref 32.0–48.0)
PH ART: 7.361 (ref 7.350–7.450)
Patient temperature: 36.7
TCO2: 24 mmol/L (ref 0–100)
TCO2: 26 mmol/L (ref 0–100)
pH, Arterial: 7.375 (ref 7.350–7.450)
pO2, Arterial: 130 mmHg — ABNORMAL HIGH (ref 83.0–108.0)
pO2, Arterial: 90 mmHg (ref 83.0–108.0)

## 2017-03-22 LAB — MAGNESIUM
MAGNESIUM: 2.6 mg/dL — AB (ref 1.7–2.4)
Magnesium: 2 mg/dL (ref 1.7–2.4)

## 2017-03-22 LAB — POCT I-STAT, CHEM 8
BUN: 9 mg/dL (ref 6–20)
CALCIUM ION: 1.28 mmol/L (ref 1.15–1.40)
CREATININE: 0.5 mg/dL (ref 0.44–1.00)
Chloride: 100 mmol/L — ABNORMAL LOW (ref 101–111)
GLUCOSE: 116 mg/dL — AB (ref 65–99)
HCT: 30 % — ABNORMAL LOW (ref 36.0–46.0)
HEMOGLOBIN: 10.2 g/dL — AB (ref 12.0–15.0)
Potassium: 4.2 mmol/L (ref 3.5–5.1)
Sodium: 139 mmol/L (ref 135–145)
TCO2: 23 mmol/L (ref 0–100)

## 2017-03-22 LAB — BPAM PLATELET PHERESIS
BLOOD PRODUCT EXPIRATION DATE: 201807192359
ISSUE DATE / TIME: 201807191828
Unit Type and Rh: 5100

## 2017-03-22 LAB — GLUCOSE, CAPILLARY
GLUCOSE-CAPILLARY: 111 mg/dL — AB (ref 65–99)
GLUCOSE-CAPILLARY: 112 mg/dL — AB (ref 65–99)
GLUCOSE-CAPILLARY: 167 mg/dL — AB (ref 65–99)
GLUCOSE-CAPILLARY: 122 mg/dL — AB (ref 65–99)
Glucose-Capillary: 148 mg/dL — ABNORMAL HIGH (ref 65–99)
Glucose-Capillary: 161 mg/dL — ABNORMAL HIGH (ref 65–99)
Glucose-Capillary: 94 mg/dL (ref 65–99)

## 2017-03-22 LAB — CREATININE, SERUM
Creatinine, Ser: 0.74 mg/dL (ref 0.44–1.00)
GFR calc non Af Amer: >60 mL/min (ref >60)

## 2017-03-22 LAB — BPAM CRYOPRECIPITATE
Blood Product Expiration Date: 201807191815
Blood Product Expiration Date: 201807191815
Blood Product Expiration Date: 201807200048
ISSUE DATE / TIME: 201807191243
ISSUE DATE / TIME: 201807191243
ISSUE DATE / TIME: 201807191903
UNIT TYPE AND RH: 6200
Unit Type and Rh: 6200
Unit Type and Rh: 6200

## 2017-03-22 LAB — BPAM FFP
Blood Product Expiration Date: 201807222359
Blood Product Expiration Date: 201807222359
ISSUE DATE / TIME: 201807191825
ISSUE DATE / TIME: 201807200018
UNIT TYPE AND RH: 6200
Unit Type and Rh: 6200

## 2017-03-22 MED ORDER — ORAL CARE MOUTH RINSE
15.0000 mL | Freq: Two times a day (BID) | OROMUCOSAL | Status: DC
  Administered 2017-03-22 (×2): 15 mL via OROMUCOSAL

## 2017-03-22 MED ORDER — INSULIN ASPART 100 UNIT/ML ~~LOC~~ SOLN
0.0000 [IU] | SUBCUTANEOUS | Status: DC
  Administered 2017-03-22: 2 [IU] via SUBCUTANEOUS
  Administered 2017-03-22: 4 [IU] via SUBCUTANEOUS
  Administered 2017-03-23: 2 [IU] via SUBCUTANEOUS

## 2017-03-22 MED ORDER — METOCLOPRAMIDE HCL 5 MG/ML IJ SOLN
10.0000 mg | Freq: Four times a day (QID) | INTRAMUSCULAR | Status: AC
  Administered 2017-03-22 – 2017-03-23 (×4): 10 mg via INTRAVENOUS
  Filled 2017-03-22 (×4): qty 2

## 2017-03-22 MED FILL — Heparin Sodium (Porcine) Inj 1000 Unit/ML: INTRAMUSCULAR | Qty: 30 | Status: AC

## 2017-03-22 MED FILL — Magnesium Sulfate Inj 50%: INTRAMUSCULAR | Qty: 10 | Status: AC

## 2017-03-22 MED FILL — Potassium Chloride Inj 2 mEq/ML: INTRAVENOUS | Qty: 40 | Status: AC

## 2017-03-22 MED FILL — Sodium Chloride IV Soln 0.9%: INTRAVENOUS | Qty: 1000 | Status: AC

## 2017-03-22 NOTE — Progress Notes (Signed)
1 Day Post-Op Procedure(s) (LRB): MEDIASTINAL EXPLORATION FOR BLEEDING (N/A) Subjective:  No complaints  Objective: Vital signs in last 24 hours: Temp:  [94.5 F (34.7 C)-99.5 F (37.5 C)] 98.1 F (36.7 C) (07/20 0600) Pulse Rate:  [63-86] 80 (07/20 0600) Cardiac Rhythm: Atrial paced (07/20 0100) Resp:  [8-26] 14 (07/20 0600) BP: (74-132)/(47-96) 83/67 (07/20 0600) SpO2:  [97 %-100 %] 100 % (07/20 0600) Arterial Line BP: (64-131)/(43-86) 96/55 (07/20 0600) FiO2 (%):  [40 %-50 %] 40 % (07/20 0001) Weight:  [59.9 kg (132 lb 0.9 oz)] 59.9 kg (132 lb 0.9 oz) (07/20 0500)  Hemodynamic parameters for last 24 hours: PAP: (9-27)/(2-19) 24/13 CO:  [2.7 L/min-6.5 L/min] 4.3 L/min CI:  [1.7 L/min/m2-4 L/min/m2] 2.7 L/min/m2  Intake/Output from previous day: 07/19 0701 - 07/20 0700 In: 5723.2 [P.O.:60; I.V.:2880.2; YNWGN:5621; NG/GT:90; IV Piggyback:1000] Out: 3086 [Urine:2090; Emesis/NG output:60; Blood:600; Chest VHQI:6962] Intake/Output this shift: No intake/output data recorded.  General appearance: alert and cooperative Neurologic: intact Heart: regular rate and rhythm, S1, S2 normal, no murmur, click, rub or gallop Lungs: clear to auscultation bilaterally Extremities: edema mild Wound: dressing dry  Lab Results:  Recent Labs  03/21/17 2125 03/22/17 0348  WBC 6.6 8.6  HGB 10.2* 10.0*  HCT 31.0* 30.0*  PLT 95* 98*   BMET:  Recent Labs  03/19/17 1242 03/21/17 1900 03/21/17 2107 03/22/17 0348  NA 135 142 143 140  K 3.9 4.9 4.2 4.3  CL 104 108  --  112*  CO2 18*  --   --  23  GLUCOSE 97 115* 100* 121*  BUN 16 12  --  10  CREATININE 0.68 0.40*  --  0.69  CALCIUM 9.6  --   --  8.2*    PT/INR:  Recent Labs  03/21/17 2125  LABPROT 15.8*  INR 1.25   ABG    Component Value Date/Time   PHART 7.375 03/22/2017 0220   HCO3 25.1 03/22/2017 0220   TCO2 26 03/22/2017 0220   ACIDBASEDEF 3.0 (H) 03/22/2017 0025   O2SAT 97.0 03/22/2017 0220   CBG (last 3)    Recent Labs  03/21/17 2327 03/22/17 0002 03/22/17 0359  GLUCAP 91 94 111*   CXR: ok  ECG: NSR, no acute changes  Assessment/Plan: S/P Procedure(s) (LRB): MEDIASTINAL EXPLORATION FOR BLEEDING (N/A)  She has been hemodynamically stable overnight but still on a little neo. Wean as tolerated. Will hold Lopressor today until stable off neo. Sinus rhythm but being paced to augment BP. CT output low, thin serosanguinous. Will keep tubes in until later today. Mobilize Diuresis once of neo Continue foley due to diuresing patient and patient in ICU Acute postop blood loss anemia: stable. See progression orders   LOS: 1 day    Jessica [name] 03/22/2017

## 2017-03-22 NOTE — Anesthesia Postprocedure Evaluation (Signed)
Anesthesia Post Note  Patient: Jessica [name]  Procedure(s) Performed: Procedure(s) (LRB): BENTALL PROCEDURE (N/A) RESECTION OF ASCENDING ANEURYSM (N/A) TRANSESOPHAGEAL ECHOCARDIOGRAM (TEE) (N/A)     Patient location during evaluation: SICU Anesthesia Type: General Level of consciousness: awake Pain management: pain level controlled Vital Signs Assessment: post-procedure vital signs reviewed and stable Respiratory status: spontaneous breathing Cardiovascular status: stable Anesthetic complications: no Comments: Ms. Ingman returned to OR for chest tube output. Tolerated second procedure. Received additional 2 Units RBCS and 1 FFP intraoperatively. Extubated this morning and not requiring vasopressor support. She is currently sittiing in chair and visiting with family. She complains of mild pain but is otherwise doing well.     Last Vitals:  Vitals:   03/22/17 1030 03/22/17 1201  BP: (!) 98/56 91/65  Pulse: 79 76  Resp:  18  Temp:  36.8 C    Last Pain:  Vitals:   03/22/17 1201  TempSrc: Oral  PainSc:                  Effie Berkshire

## 2017-03-22 NOTE — Procedures (Signed)
Extubation Procedure Note  Pt extubated following Cardiac Rapid Wean. Pt follows all commands. NIF -30 VC 1.2L Cuff leak + No stridor. 4L [place], Pt A&O   Patient Details:   Name: Jessica [name] DOB: [id number] MRN: [id number]   Airway Documentation:     Evaluation  O2 sats: stable throughout Complications: No apparent complications Patient did tolerate procedure well. Bilateral Breath Sounds: Clear   Yes  Sharen Hint 03/22/2017, 12:36 AM

## 2017-03-22 NOTE — Op Note (Signed)
03/22/2017 Clarene Duke Firman [id number]  Surgeon: Gaye Pollack, MD   First Assistant: Vernie Murders, RNFA  Preoperative Diagnosis: mediastinal bleeding s/p Bentall procedure.  Postoperative Diagnosis: Same   Procedure:  1.  Median Sternotomy 2. Evacuation of mediastinal hematoma 3. Ligation of bleeding thymic vessel  Anesthesia: General Endotracheal   Clinical History/Surgical Indication:   The patient had undergone Bentall procedure and resection of an ascending aortic aneurysm earlier in the day without complication. She had good hemostasis at the conclusion of that procedure. In the ICU she began have some bleeding from the chest tubes at 300 cc the first hr and then it began decreasing but fluctuated between 100-300 cc/hr for the next few hrs despite her coagulation profile being normal. The decision was made to return to the OR for exploration. I discussed the procedure and risks including blood transfusion and infection with her husband at the bedside and he agreed to proceed.  Preparation:  The patient was taken directly to the operating room.The consent was signed by me. Preoperative antibiotics were given.  The patient was positioned supine on the operating room table. General endotracheal anesthesia was induced. The neck, chest, and abdomen were prepped with betadine soap and solution and draped in the usual sterile manner. A surgical time-out was taken and the correct patient and operative procedure were confirmed with the nursing and anesthesia staff.   Median Sternotomy:   The previous median sternotomy was opened. Upon separating the sternum there was clotted blood mainly around the aortic graft and upper anterior mediastinum. The clot was removed.  The cannulation sites and arterial anastomoses were hemostatic. A small bleeding vessel was seen in the thymic fat and this was ligated with a clip. The remainder of the mediastinum appeared hemostatic. The mediastinum was irrigated  with saline. There was complete hemostasis.   Completion:   Mediastinal drainage tubes were declotted and returned to their normal position.  The sternum was closed with  #6 stainless steel wires. The fascia was closed with continuous # 1 vicryl suture. The subcutaneous tissue was closed with 2-0 vicryl continuous suture. The skin was closed with 3-0 vicryl subcuticular suture. All sponge, needle, and instrument counts were reported correct at the end of the case. Dry sterile dressings were placed over the incisions and around the chest tubes which were connected to pleurevac suction. The patient was then transported to the surgical intensive care unit in satisfactory and stable condition.

## 2017-03-22 NOTE — Plan of Care (Signed)
Problem: Cardiac: Goal: Hemodynamic stability will improve Outcome: Completed/Met Date Met: 03/22/17 All hemo gtts weaned off Goal: Ability to maintain an adequate cardiac output will improve Outcome: Completed/Met Date Met: 03/22/17 Ci< 2 Goal: Will show no signs and symptoms of excessive bleeding Outcome: Completed/Met Date Met: 03/22/17 Min bleeding  Problem: Physical Regulation: Goal: Diagnostic test results will improve Outcome: Progressing All test WNL  Problem: Respiratory: Goal: Levels of oxygenation will improve Outcome: Completed/Met Date Met: 03/22/17 Pt extubated Goal: Respiratory status will improve Outcome: Completed/Met Date Met: 03/22/17 Pt extubated Goal: Ability to tolerate decreased levels of ventilator support will improve Outcome: Completed/Met Date Met: 03/22/17 Pt extubated 7/20  Problem: Pain Management: Goal: Pain level will decrease Outcome: Progressing Pain managed with IV and PO pain meds

## 2017-03-22 NOTE — Care Management Note (Signed)
Case Management Note Marvetta Gibbons RN, BSN Unit 4E-Case Manager-- Daisy coverage [phone]  Patient Details  Name: Jessica [name] MRN: [id number] Date of Birth: 07-Apr-1961  Subjective/Objective:   Pt admitted s/p Bentall procedure and resection of an ascending aortic aneurysm - return to OR post op secondary to bleeding for reexploration                 Action/Plan: PTA pt lived at home with spouse- independent- CM will follow for d/c needs  Expected Discharge Date:                  Expected Discharge Plan:  Home/Self Care  In-House Referral:     Discharge planning Services  CM Consult  Post Acute Care Choice:    Choice offered to:     DME Arranged:    DME Agency:     HH Arranged:    [place] Agency:     Status of Service:  In process, will continue to follow  If discussed at Long Length of Stay Meetings, dates discussed:    Discharge Disposition:   Additional Comments:  Dawayne Patricia, RN 03/22/2017, 10:01 AM

## 2017-03-22 NOTE — Progress Notes (Signed)
Patient ID: Jessica [name], female   DOB: September 05, 1960, 56 y.o.   MRN: [id number] EVENING ROUNDS NOTE :     [address]       [place] [address]             [phone]                 1 Day Post-Op Procedure(s) (LRB): MEDIASTINAL EXPLORATION FOR BLEEDING (N/A)  Total Length of Stay:  LOS: 1 day  BP 90/64   Pulse 77   Temp 98.4 F (36.9 C) (Oral)   Resp 16   Ht 5\' 4"  (1.626 m)   Wt 132 lb 0.9 oz (59.9 kg)   SpO2 96%   BMI 22.67 kg/m   .Intake/Output      07/20 0701 - 07/21 0700   P.O. 600   I.V. (mL/kg) 106.9 (1.8)   IV Piggyback 150   Total Intake(mL/kg) 856.9 (14.3)   Urine (mL/kg/hr) 250 (0.3)   Chest Tube 210   Total Output 460   Net +396.9         . sodium chloride 250 mL (03/22/17 0502)  . sodium chloride    . sodium chloride    . lactated ringers Stopped (03/22/17 1100)  . lactated ringers    . nitroGLYCERIN Stopped (03/22/17 0100)  . phenylephrine (NEO-SYNEPHRINE) Adult infusion Stopped (03/22/17 0900)  . vancomycin       Lab Results  Component Value Date   WBC 9.4 03/22/2017   HGB 10.2 (L) 03/22/2017   HCT 30.0 (L) 03/22/2017   PLT 99 (L) 03/22/2017   GLUCOSE 116 (H) 03/22/2017   ALT 15 03/19/2017   AST 22 03/19/2017   NA 139 03/22/2017   K 4.2 03/22/2017   CL 100 (L) 03/22/2017   CREATININE 0.50 03/22/2017   BUN 9 03/22/2017   CO2 23 03/22/2017   INR 1.25 03/21/2017   HGBA1C 5.1 03/19/2017   Stable day, walked around unit Holding sinus  Tissue composite Bentall, no coumadin  Grace Isaac MD  Beeper [phone] Office [phone] 03/22/2017 7:56 PM

## 2017-03-22 NOTE — Brief Op Note (Signed)
03/21/2017  7:18 AM  PATIENT:  Jessica [name]  56 y.o. female  PRE-OPERATIVE DIAGNOSIS:  Post-Operative BLEEDING  POST-OPERATIVE DIAGNOSIS:  Post-Operative BLEEDING  PROCEDURE:  Procedure(s): MEDIASTINAL EXPLORATION FOR BLEEDING (N/A)  SURGEON:  Surgeon(s) and Role:    * Mckenna Gamm, Fernande Boyden, MD - Primary  PHYSICIAN ASSISTANT: none  ASSISTANTS: none   ANESTHESIA:   general  EBL:  No intake/output data recorded.  COUNTS:  YES  TOURNIQUET:  * No tourniquets in log *  DICTATION: .Note written in [place]: Admit to inpatient   PATIENT DISPOSITION:  ICU - intubated and hemodynamically stable.   Delay start of Pharmacological VTE agent (>24hrs) due to surgical blood loss or risk of bleeding: yes

## 2017-03-23 ENCOUNTER — Inpatient Hospital Stay (HOSPITAL_COMMUNITY): Payer: BLUE CROSS/BLUE SHIELD

## 2017-03-23 LAB — BASIC METABOLIC PANEL
ANION GAP: 7 (ref 5–15)
BUN: 7 mg/dL (ref 6–20)
CALCIUM: 8.5 mg/dL — AB (ref 8.9–10.3)
CO2: 25 mmol/L (ref 22–32)
Chloride: 103 mmol/L (ref 101–111)
Creatinine, Ser: 0.63 mg/dL (ref 0.44–1.00)
GFR calc Af Amer: >60 mL/min (ref >60)
GLUCOSE: 127 mg/dL — AB (ref 65–99)
Potassium: 4.1 mmol/L (ref 3.5–5.1)
Sodium: 135 mmol/L (ref 135–145)

## 2017-03-23 LAB — CBC
HEMATOCRIT: 31.6 % — AB (ref 36.0–46.0)
HEMOGLOBIN: 10.2 g/dL — AB (ref 12.0–15.0)
MCH: 29.5 pg (ref 26.0–34.0)
MCHC: 32.3 g/dL (ref 30.0–36.0)
MCV: 91.3 fL (ref 78.0–100.0)
Platelets: 97 10*3/uL — ABNORMAL LOW (ref 150–400)
RBC: 3.46 MIL/uL — ABNORMAL LOW (ref 3.87–5.11)
RDW: 17.6 % — AB (ref 11.5–15.5)
WBC: 8.8 10*3/uL (ref 4.0–10.5)

## 2017-03-23 LAB — GLUCOSE, CAPILLARY
GLUCOSE-CAPILLARY: 126 mg/dL — AB (ref 65–99)
Glucose-Capillary: 114 mg/dL — ABNORMAL HIGH (ref 65–99)

## 2017-03-23 MED ORDER — SODIUM CHLORIDE 0.9 % IV SOLN: 250.0000 mL | INTRAVENOUS | Status: DC | PRN

## 2017-03-23 MED ORDER — FUROSEMIDE 10 MG/ML IJ SOLN
20.0000 mg | Freq: Four times a day (QID) | INTRAMUSCULAR | Status: AC
  Administered 2017-03-23 – 2017-03-24 (×3): 20 mg via INTRAVENOUS
  Filled 2017-03-23 (×3): qty 2

## 2017-03-23 MED ORDER — FUROSEMIDE 40 MG PO TABS
40.0000 mg | ORAL_TABLET | Freq: Every day | ORAL | Status: DC
  Administered 2017-03-24 – 2017-03-25 (×2): 40 mg via ORAL
  Filled 2017-03-23 (×3): qty 1

## 2017-03-23 MED ORDER — SODIUM CHLORIDE 0.9% FLUSH: 3.0000 mL | INTRAVENOUS | Status: DC | PRN

## 2017-03-23 MED ORDER — SODIUM CHLORIDE 0.9% FLUSH
3.0000 mL | Freq: Two times a day (BID) | INTRAVENOUS | Status: DC
  Administered 2017-03-23 – 2017-03-25 (×4): 3 mL via INTRAVENOUS

## 2017-03-23 MED ORDER — MOVING RIGHT ALONG BOOK
Freq: Once | Status: AC
  Administered 2017-03-23: 11:00:00
  Filled 2017-03-23: qty 1

## 2017-03-23 MED ORDER — POTASSIUM CHLORIDE CRYS ER 20 MEQ PO TBCR
20.0000 meq | EXTENDED_RELEASE_TABLET | Freq: Every day | ORAL | Status: DC
  Administered 2017-03-24: 20 meq via ORAL
  Filled 2017-03-23: qty 1

## 2017-03-23 NOTE — Progress Notes (Signed)
1358- Patient's BP 77/61, too low for ambulation at this time. Patient's nurse is aware and working on it. Will f/u on Monday. Sol Passer, MS, ACSM CEP

## 2017-03-23 NOTE — Progress Notes (Signed)
[facility] BRIEF SICU PROGRESS NOTE  2 Days Post-Op  S/P Procedure(s) (LRB): MEDIASTINAL EXPLORATION FOR BLEEDING (N/A)   Stable day although systolic BP borderline low No dizziness NSR and breathing comfortably UOP adequate  Plan: Hold transfer and watch BP  Rexene Alberts, MD 03/23/2017 4:59 PM

## 2017-03-23 NOTE — Progress Notes (Signed)
      [address]       [place] [address]             [phone]        CARDIOTHORACIC SURGERY PROGRESS NOTE   R2 Days Post-Op Procedure(s) (LRB): MEDIASTINAL EXPLORATION FOR BLEEDING (N/A)  Subjective: Looks good and feels well.  Mild soreness in chest.  Ambulated twice this morning.  Nausea improved  Objective: Vital signs: BP Readings from Last 1 Encounters:  03/23/17 91/65   Pulse Readings from Last 1 Encounters:  03/23/17 76   Resp Readings from Last 1 Encounters:  03/23/17 16   Temp Readings from Last 1 Encounters:  03/23/17 98.2 F (36.8 C) (Oral)    Hemodynamics: PAP: (27)/(14) 27/14  Physical Exam:  Rhythm:   sinus  Breath sounds: clear  Heart sounds:  RRR  Incisions:  Dressings dry, intact  Abdomen:  Soft, non-distended, non-tender  Extremities:  Warm, well-perfused   Intake/Output from previous day: 07/20 0701 - 07/21 0700 In: 1126.9 [P.O.:840; I.V.:136.9; IV Piggyback:150] Out: 740 [Urine:530; Chest Tube:210] Intake/Output this shift: Total I/O In: 240 [P.O.:240] Out: -   Lab Results:  CBC: Recent Labs  03/22/17 1704 03/22/17 1717 03/23/17 0345  WBC 9.4  --  8.8  HGB 10.1* 10.2* 10.2*  HCT 31.4* 30.0* 31.6*  PLT 99*  --  97*    BMET:  Recent Labs  03/22/17 0348  03/22/17 1717 03/23/17 0345  NA 140  --  139 135  K 4.3  --  4.2 4.1  CL 112*  --  100* 103  CO2 23  --   --  25  GLUCOSE 121*  --  116* 127*  BUN 10  --  9 7  CREATININE 0.69  < > 0.50 0.63  CALCIUM 8.2*  --   --  8.5*  < > = values in this interval not displayed.   PT/INR:   Recent Labs  03/21/17 2125  LABPROT 15.8*  INR 1.25    CBG (last 3)   Recent Labs  03/22/17 2002 03/23/17 0341 03/23/17 0749  GLUCAP 148* 126* 114*    ABG    Component Value Date/Time   PHART 7.375 03/22/2017 0220   PCO2ART 42.8 03/22/2017 0220   PO2ART 90.0 03/22/2017 0220   HCO3 25.1 03/22/2017 0220   TCO2 23 03/22/2017 1717   ACIDBASEDEF 3.0 (H)  03/22/2017 0025   O2SAT 97.0 03/22/2017 0220    CXR: PORTABLE CHEST 1 VIEW  COMPARISON:  03/22/2017  FINDINGS: Swan-Ganz catheter removed is sheath in the SVC. Mediastinal drains removed. Aortic valve replacement again noted.  Progression of bibasilar atelectasis. Negative for edema. Negative for pneumothorax  IMPRESSION: Hypoventilation with increase in bibasilar atelectasis. Negative for pneumothorax.   Electronically Signed   By: Franchot Gallo M.D.   On: 03/23/2017 07:26  Assessment/Plan: S/P Procedure(s) (LRB): MEDIASTINAL EXPLORATION FOR BLEEDING (N/A)  Doing well POD2 Maintaining NSR w/ stable BP Breathing comfortably w/ O2 sats 92-96% on RA Expected post op volume excess, weight reportedly 14 lbs > preop Expected post op atelectasis, mild Expected post op acute blood loss anemia, mild Post op thrombocytopenia, mild   Mobilize  Diuresis  Transfer step down   Rexene Alberts, MD 03/23/2017 10:40 AM

## 2017-03-24 ENCOUNTER — Inpatient Hospital Stay (HOSPITAL_COMMUNITY): Payer: BLUE CROSS/BLUE SHIELD

## 2017-03-24 LAB — BASIC METABOLIC PANEL
ANION GAP: 6 (ref 5–15)
BUN: 9 mg/dL (ref 6–20)
CALCIUM: 8.5 mg/dL — AB (ref 8.9–10.3)
CO2: 28 mmol/L (ref 22–32)
CREATININE: 0.63 mg/dL (ref 0.44–1.00)
Chloride: 102 mmol/L (ref 101–111)
GLUCOSE: 117 mg/dL — AB (ref 65–99)
Potassium: 3.5 mmol/L (ref 3.5–5.1)
Sodium: 136 mmol/L (ref 135–145)

## 2017-03-24 LAB — CBC
HCT: 31 % — ABNORMAL LOW (ref 36.0–46.0)
Hemoglobin: 9.9 g/dL — ABNORMAL LOW (ref 12.0–15.0)
MCH: 29.2 pg (ref 26.0–34.0)
MCHC: 31.9 g/dL (ref 30.0–36.0)
MCV: 91.4 fL (ref 78.0–100.0)
PLATELETS: 136 10*3/uL — AB (ref 150–400)
RBC: 3.39 MIL/uL — ABNORMAL LOW (ref 3.87–5.11)
RDW: 16.6 % — AB (ref 11.5–15.5)
WBC: 10 10*3/uL (ref 4.0–10.5)

## 2017-03-24 MED ORDER — POTASSIUM CHLORIDE CRYS ER 20 MEQ PO TBCR
40.0000 meq | EXTENDED_RELEASE_TABLET | Freq: Once | ORAL | Status: AC
Start: 2017-03-24 — End: 2017-03-24
  Administered 2017-03-24: 40 meq via ORAL
  Filled 2017-03-24: qty 2

## 2017-03-24 NOTE — Progress Notes (Signed)
      [address]       [place] [address]             [phone]        CARDIOTHORACIC SURGERY PROGRESS NOTE   R3 Days Post-Op Procedure(s) (LRB): MEDIASTINAL EXPLORATION FOR BLEEDING (N/A)  Subjective: Feels well.  No complaints  Objective: Vital signs: BP Readings from Last 1 Encounters:  03/24/17 106/76   Pulse Readings from Last 1 Encounters:  03/24/17 75   Resp Readings from Last 1 Encounters:  03/24/17 16   Temp Readings from Last 1 Encounters:  03/23/17 99.2 F (37.3 C) (Oral)    Hemodynamics:    Physical Exam:  Rhythm:   sinus  Breath sounds: clear  Heart sounds:  RRR  Incisions:  Dressing dry, intact  Abdomen:  Soft, non-distended, non-tender  Extremities:  Warm, well-perfused   Intake/Output from previous day: 07/21 0701 - 07/22 0700 In: 720 [P.O.:720] Out: 1650 [Urine:1650] Intake/Output this shift: No intake/output data recorded.  Lab Results:  CBC: Recent Labs  03/23/17 0345 03/24/17 0228  WBC 8.8 10.0  HGB 10.2* 9.9*  HCT 31.6* 31.0*  PLT 97* 136*    BMET:  Recent Labs  03/23/17 0345 03/24/17 0228  NA 135 136  K 4.1 3.5  CL 103 102  CO2 25 28  GLUCOSE 127* 117*  BUN 7 9  CREATININE 0.63 0.63  CALCIUM 8.5* 8.5*     PT/INR:   Recent Labs  03/21/17 2125  LABPROT 15.8*  INR 1.25    CBG (last 3)   Recent Labs  03/22/17 2002 03/23/17 0341 03/23/17 0749  GLUCAP 148* 126* 114*    ABG    Component Value Date/Time   PHART 7.375 03/22/2017 0220   PCO2ART 42.8 03/22/2017 0220   PO2ART 90.0 03/22/2017 0220   HCO3 25.1 03/22/2017 0220   TCO2 23 03/22/2017 1717   ACIDBASEDEF 3.0 (H) 03/22/2017 0025   O2SAT 97.0 03/22/2017 0220    CXR: CHEST  2 VIEW  COMPARISON:  03/23/2017  FINDINGS: Aortic valve replacement unchanged. Small bilateral pleural effusions unchanged. Mild progression of right lower lobe atelectasis. No left lower lobe atelectasis.  IMPRESSION: Small bilateral  effusions unchanged. Bibasilar atelectasis with mild progression on the right. Negative for pulmonary edema.   Electronically Signed   By: Franchot Gallo M.D.   On: 03/24/2017 08:16   Assessment/Plan: S/P Procedure(s) (LRB): MEDIASTINAL EXPLORATION FOR BLEEDING (N/A)  Doing well POD3 Maintaining NSR w/ stable BP - improved overnight Breathing comfortably w/ O2 sats 92-96% on RA Expected post op volume excess, weight down but still reportedly 11 lbs > preop Expected post op atelectasis, mild Expected post op acute blood loss anemia, mild Post op thrombocytopenia, mild Hypokalemia, induced by loop diuretics   Mobilize  Diuresis  Supplement potassium  Transfer tele  Rexene Alberts, MD 03/24/2017 9:03 AM

## 2017-03-24 NOTE — Plan of Care (Signed)
Problem: Bowel/Gastric: Goal: Gastrointestinal status for postoperative course will improve Outcome: Progressing Pt progressing with positive flatus and active bowel sounds, yet patient has yet to have a bowel movement postop. Colace is ordered and has been given daily.   Problem: Skin Integrity: Goal: Wound healing without signs and symptoms of infection Outcome: Progressing Sternal dressing changed tonight. Incision within normal limits.

## 2017-03-24 NOTE — Plan of Care (Signed)
Problem: Pain Managment: Goal: General experience of comfort will improve Outcome: Completed/Met Date Met: 03/24/17 Pain control being met adequately with only Ultram.

## 2017-03-25 LAB — POCT I-STAT, CHEM 8
BUN: 15 mg/dL (ref 6–20)
BUN: 10 mg/dL (ref 6–20)
BUN: 9 mg/dL (ref 6–20)
BUN: 10 mg/dL (ref 6–20)
BUN: 10 mg/dL (ref 6–20)
BUN: 11 mg/dL (ref 6–20)
BUN: 11 mg/dL (ref 6–20)
BUN: 11 mg/dL (ref 6–20)
BUN: 10 mg/dL (ref 6–20)
CALCIUM ION: 1.16 mmol/L (ref 1.15–1.40)
CALCIUM ION: 1.2 mmol/L (ref 1.15–1.40)
CALCIUM ION: 1.04 mmol/L — AB (ref 1.15–1.40)
CALCIUM ION: 1.05 mmol/L — AB (ref 1.15–1.40)
CALCIUM ION: 1.05 mmol/L — AB (ref 1.15–1.40)
CHLORIDE: 102 mmol/L (ref 101–111)
CHLORIDE: 103 mmol/L (ref 101–111)
CREATININE: 0.6 mg/dL (ref 0.44–1.00)
CREATININE: 0.5 mg/dL (ref 0.44–1.00)
Calcium, Ion: 1.04 mmol/L — ABNORMAL LOW (ref 1.15–1.40)
Calcium, Ion: 1.04 mmol/L — ABNORMAL LOW (ref 1.15–1.40)
Calcium, Ion: 1.05 mmol/L — ABNORMAL LOW (ref 1.15–1.40)
Calcium, Ion: 0.98 mmol/L — ABNORMAL LOW (ref 1.15–1.40)
Chloride: 105 mmol/L (ref 101–111)
Chloride: 103 mmol/L (ref 101–111)
Chloride: 107 mmol/L (ref 101–111)
Chloride: 106 mmol/L (ref 101–111)
Chloride: 103 mmol/L (ref 101–111)
Chloride: 106 mmol/L (ref 101–111)
Chloride: 105 mmol/L (ref 101–111)
Creatinine, Ser: 0.5 mg/dL (ref 0.44–1.00)
Creatinine, Ser: 0.5 mg/dL (ref 0.44–1.00)
Creatinine, Ser: 0.4 mg/dL — ABNORMAL LOW (ref 0.44–1.00)
Creatinine, Ser: 0.5 mg/dL (ref 0.44–1.00)
Creatinine, Ser: 0.3 mg/dL — ABNORMAL LOW (ref 0.44–1.00)
Creatinine, Ser: 0.4 mg/dL — ABNORMAL LOW (ref 0.44–1.00)
Creatinine, Ser: 0.4 mg/dL — ABNORMAL LOW (ref 0.44–1.00)
GLUCOSE: 115 mg/dL — AB (ref 65–99)
GLUCOSE: 63 mg/dL — AB (ref 65–99)
GLUCOSE: 84 mg/dL (ref 65–99)
GLUCOSE: 59 mg/dL — AB (ref 65–99)
GLUCOSE: 119 mg/dL — AB (ref 65–99)
GLUCOSE: 100 mg/dL — AB (ref 65–99)
Glucose, Bld: 126 mg/dL — ABNORMAL HIGH (ref 65–99)
Glucose, Bld: 139 mg/dL — ABNORMAL HIGH (ref 65–99)
Glucose, Bld: 110 mg/dL — ABNORMAL HIGH (ref 65–99)
HCT: 38 % (ref 36.0–46.0)
HCT: 27 % — ABNORMAL LOW (ref 36.0–46.0)
HCT: 35 % — ABNORMAL LOW (ref 36.0–46.0)
HCT: 32 % — ABNORMAL LOW (ref 36.0–46.0)
HCT: 29 % — ABNORMAL LOW (ref 36.0–46.0)
HCT: 26 % — ABNORMAL LOW (ref 36.0–46.0)
HEMATOCRIT: 26 % — AB (ref 36.0–46.0)
HEMATOCRIT: 29 % — AB (ref 36.0–46.0)
HEMATOCRIT: 25 % — AB (ref 36.0–46.0)
HEMOGLOBIN: 9.2 g/dL — AB (ref 12.0–15.0)
HEMOGLOBIN: 10.9 g/dL — AB (ref 12.0–15.0)
HEMOGLOBIN: 8.8 g/dL — AB (ref 12.0–15.0)
HEMOGLOBIN: 9.9 g/dL — AB (ref 12.0–15.0)
HEMOGLOBIN: 8.8 g/dL — AB (ref 12.0–15.0)
HEMOGLOBIN: 9.9 g/dL — AB (ref 12.0–15.0)
HEMOGLOBIN: 8.5 g/dL — AB (ref 12.0–15.0)
Hemoglobin: 12.9 g/dL (ref 12.0–15.0)
Hemoglobin: 11.9 g/dL — ABNORMAL LOW (ref 12.0–15.0)
POTASSIUM: 3.8 mmol/L (ref 3.5–5.1)
POTASSIUM: 5.3 mmol/L — AB (ref 3.5–5.1)
POTASSIUM: 4.3 mmol/L (ref 3.5–5.1)
POTASSIUM: 4.5 mmol/L (ref 3.5–5.1)
POTASSIUM: 3.7 mmol/L (ref 3.5–5.1)
Potassium: 5.7 mmol/L — ABNORMAL HIGH (ref 3.5–5.1)
Potassium: 4.1 mmol/L (ref 3.5–5.1)
Potassium: 3.9 mmol/L (ref 3.5–5.1)
Potassium: 4.4 mmol/L (ref 3.5–5.1)
SODIUM: 141 mmol/L (ref 135–145)
SODIUM: 142 mmol/L (ref 135–145)
SODIUM: 138 mmol/L (ref 135–145)
SODIUM: 141 mmol/L (ref 135–145)
SODIUM: 139 mmol/L (ref 135–145)
Sodium: 142 mmol/L (ref 135–145)
Sodium: 141 mmol/L (ref 135–145)
Sodium: 139 mmol/L (ref 135–145)
Sodium: 140 mmol/L (ref 135–145)
TCO2: 28 mmol/L (ref 0–100)
TCO2: 26 mmol/L (ref 0–100)
TCO2: 26 mmol/L (ref 0–100)
TCO2: 24 mmol/L (ref 0–100)
TCO2: 23 mmol/L (ref 0–100)
TCO2: 27 mmol/L (ref 0–100)
TCO2: 27 mmol/L (ref 0–100)
TCO2: 28 mmol/L (ref 0–100)
TCO2: 27 mmol/L (ref 0–100)

## 2017-03-25 LAB — BPAM RBC
Blood Product Expiration Date: 201808112359
Blood Product Expiration Date: 201808132359
Blood Product Expiration Date: 201808132359
Blood Product Expiration Date: 201808132359
Blood Product Expiration Date: 201808132359
ISSUE DATE / TIME: 201807191722
ISSUE DATE / TIME: 201807191811
ISSUE DATE / TIME: 201807191811
ISSUE DATE / TIME: 201807191811
ISSUE DATE / TIME: 201807191811
Unit Type and Rh: 600
Unit Type and Rh: 600
Unit Type and Rh: 600
Unit Type and Rh: 600
Unit Type and Rh: 600

## 2017-03-25 LAB — BASIC METABOLIC PANEL
ANION GAP: 8 (ref 5–15)
BUN: 7 mg/dL (ref 6–20)
CALCIUM: 8.5 mg/dL — AB (ref 8.9–10.3)
CO2: 29 mmol/L (ref 22–32)
CREATININE: 0.62 mg/dL (ref 0.44–1.00)
Chloride: 100 mmol/L — ABNORMAL LOW (ref 101–111)
GFR calc Af Amer: >60 mL/min (ref >60)
GLUCOSE: 102 mg/dL — AB (ref 65–99)
Potassium: 4 mmol/L (ref 3.5–5.1)
Sodium: 137 mmol/L (ref 135–145)

## 2017-03-25 LAB — POCT I-STAT 3, ART BLOOD GAS (G3+)
ACID-BASE EXCESS: 1 mmol/L (ref 0.0–2.0)
Acid-Base Excess: 1 mmol/L (ref 0.0–2.0)
Acid-Base Excess: 4 mmol/L — ABNORMAL HIGH (ref 0.0–2.0)
Acid-base deficit: 2 mmol/L (ref 0.0–2.0)
BICARBONATE: 24.3 mmol/L (ref 20.0–28.0)
BICARBONATE: 26.5 mmol/L (ref 20.0–28.0)
BICARBONATE: 25.9 mmol/L (ref 20.0–28.0)
Bicarbonate: 28.2 mmol/L — ABNORMAL HIGH (ref 20.0–28.0)
Bicarbonate: 27.6 mmol/L (ref 20.0–28.0)
O2 SAT: 100 %
O2 SAT: 100 %
O2 Saturation: 100 %
O2 Saturation: 100 %
O2 Saturation: 100 %
PCO2 ART: 52.1 mmHg — AB (ref 32.0–48.0)
PCO2 ART: 46.5 mmHg (ref 32.0–48.0)
PCO2 ART: 36.1 mmHg (ref 32.0–48.0)
PH ART: 7.343 — AB (ref 7.350–7.450)
PH ART: 7.491 — AB (ref 7.350–7.450)
PO2 ART: 468 mmHg — AB (ref 83.0–108.0)
TCO2: 30 mmol/L (ref 0–100)
TCO2: 26 mmol/L (ref 0–100)
TCO2: 28 mmol/L (ref 0–100)
TCO2: 27 mmol/L (ref 0–100)
TCO2: 29 mmol/L (ref 0–100)
pCO2 arterial: 48.8 mmHg — ABNORMAL HIGH (ref 32.0–48.0)
pCO2 arterial: 48.6 mmHg — ABNORMAL HIGH (ref 32.0–48.0)
pH, Arterial: 7.341 — ABNORMAL LOW (ref 7.350–7.450)
pH, Arterial: 7.326 — ABNORMAL LOW (ref 7.350–7.450)
pH, Arterial: 7.336 — ABNORMAL LOW (ref 7.350–7.450)
pO2, Arterial: 302 mmHg — ABNORMAL HIGH (ref 83.0–108.0)
pO2, Arterial: 409 mmHg — ABNORMAL HIGH (ref 83.0–108.0)
pO2, Arterial: 369 mmHg — ABNORMAL HIGH (ref 83.0–108.0)
pO2, Arterial: 428 mmHg — ABNORMAL HIGH (ref 83.0–108.0)

## 2017-03-25 LAB — TYPE AND SCREEN
ABO/RH(D): A NEG
Antibody Screen: NEGATIVE
UNIT DIVISION: 0
UNIT DIVISION: 0
Unit division: 0
Unit division: 0
Unit division: 0

## 2017-03-25 LAB — GLUCOSE, CAPILLARY: Glucose-Capillary: 110 mg/dL — ABNORMAL HIGH (ref 65–99)

## 2017-03-25 MED ORDER — METOLAZONE 2.5 MG PO TABS
2.5000 mg | ORAL_TABLET | Freq: Once | ORAL | Status: AC
  Administered 2017-03-25: 2.5 mg via ORAL
  Filled 2017-03-25: qty 1

## 2017-03-25 MED ORDER — POTASSIUM CHLORIDE CRYS ER 20 MEQ PO TBCR
20.0000 meq | EXTENDED_RELEASE_TABLET | Freq: Two times a day (BID) | ORAL | Status: DC
  Administered 2017-03-25: 20 meq via ORAL
  Filled 2017-03-25: qty 1

## 2017-03-25 MED ORDER — POTASSIUM CHLORIDE CRYS ER 20 MEQ PO TBCR
40.0000 meq | EXTENDED_RELEASE_TABLET | Freq: Two times a day (BID) | ORAL | Status: DC
  Administered 2017-03-25: 40 meq via ORAL
  Filled 2017-03-25 (×2): qty 2

## 2017-03-25 MED FILL — Lidocaine HCl IV Inj 20 MG/ML: INTRAVENOUS | Qty: 5 | Status: AC

## 2017-03-25 MED FILL — Sodium Chloride IV Soln 0.9%: INTRAVENOUS | Qty: 2000 | Status: AC

## 2017-03-25 MED FILL — Sodium Bicarbonate IV Soln 8.4%: INTRAVENOUS | Qty: 50 | Status: AC

## 2017-03-25 MED FILL — Potassium Chloride Inj 2 mEq/ML: INTRAVENOUS | Qty: 40 | Status: AC

## 2017-03-25 MED FILL — Mannitol IV Soln 20%: INTRAVENOUS | Qty: 500 | Status: AC

## 2017-03-25 MED FILL — Magnesium Sulfate Inj 50%: INTRAMUSCULAR | Qty: 10 | Status: AC

## 2017-03-25 MED FILL — Heparin Sodium (Porcine) Inj 1000 Unit/ML: INTRAMUSCULAR | Qty: 30 | Status: AC

## 2017-03-25 MED FILL — Electrolyte-R (PH 7.4) Solution: INTRAVENOUS | Qty: 3000 | Status: AC

## 2017-03-25 MED FILL — Heparin Sodium (Porcine) Inj 1000 Unit/ML: INTRAMUSCULAR | Qty: 10 | Status: AC

## 2017-03-25 MED FILL — Heparin Sodium (Porcine) Inj 1000 Unit/ML: INTRAMUSCULAR | Qty: 2500 | Status: AC

## 2017-03-25 NOTE — Discharge Summary (Signed)
Physician Discharge Summary  Patient ID: LATONYA NELON MRN: [id number] DOB/AGE: 1960-11-20 56 y.o.  Admit date: 03/21/2017 Discharge date: 03/26/2017  Admission Diagnoses: Patient Active Problem List   Diagnosis Date Noted  . Nonrheumatic aortic valve stenosis   . Environmental allergies 07/03/2013  . Smoking 06/08/2011  . DYSPNEA 06/15/2010  . Aortic valve disorder 06/14/2010  . ATRIAL FIBRILLATION 06/14/2010     Discharge Diagnoses:  Active Problems:   S/P aortic valve replacement and aortoplasty   Discharged Condition: good  HPI: The patient is a 56 year old woman with a history of bicuspid aortic valve disease that was diagnosed in her early 43's when she was living in [place] and a heart murmur was noted. She moved back to [place] and was followed there for a few years before moving to [place] about 10 years ago. Her echo in 07/2015 showed moderate AS with a mean gradient of 26 mm Hg and a peak velocity ratio of 0.28. Her most recent echo on 12/14/2016 shows progression to severe AS with a mean gradient of 40 mm Hg with a peak velocity ratio of 0.23. Her LVEF is normal at 60-65%. She underwent cath on 12/26/2016 showing widely patent coronary arteries. There was 3+ angiographic AI and normal right heart pressures. She has some enlargement of the aortic root and ascending aorta that has been followed by [facility]. The last study on 08/09/2015 showed the sinus of valsalva to be 4.0 cm, STJ 3.6 cm, and ascending aorta 4.4 cm. Her descending aorta was 2.3 cm.She says that she feels fairly well overall. She still walks 2.5 miles daily at a good pace with her dog and has mild shortness of breath and is tired by the end but does not have to stop and does not feel limited. She does report fatigue and occasional dizziness. She has some orthopnea at times.    Hospital Course:  Ms. Dinunzio Underwent a placement of an ascending aorta using a 28 mm heme show graft and a biologic Bentall procedure  using a 23 mm Edwards magna ease pericardial valve and a 26 mm Gelweave Valsalva graft on 03/21/2017 with Dr. Cyndia Bent. Postop day 0 she was brought back to the OR for a mediastinal reexploration for bleeding. Dr. Cyndia Bent was able to ligate a bleeding thymic vessel. She was transferred to the cardiac ICU in stable condition. She was extubated in a timely manner. She was walking around the unit postop day 1 and her chest tubes were discontinued. Postop day 2 she remained hemodynamically stable and in normal sinus rhythm. She was on room air at this time with good saturation. She did have some fluid overload therefore initiated a diuretic regimen. She did have some expected postoperative atelectasis on chest x-ray. She was stable for transfer to the stepdown unit at this time. Later postop day 2 she did have some borderline low systolic blood pressure. She was held in the unit for this reason. Postop day 3 she continued to progress. We supplemented her potassium for hypokalemia. Her weight continued to trend down on a diuretic regimen. Her blood pressure improved overnight and she was weaned off all drips. She was stable for transfer to the telemetry unit. Postop day 4 her Lopressor was discontinued due to blood pressure concerns. We gave a dose of metolazone along with her Lasix to help with her diuretic regimen. She remained in the unit due to lack of beds on the stepdown unit. Her pacing wires were discontinued at this time. Postop  day 5 she was thought to be stable for discharge. Her incisions were healing well, she was ambulating with limited assistance, she was on room air, and she is stable for home.  Consults: None  Significant Diagnostic Studies:  CLINICAL DATA:  Atelectasis  EXAM: CHEST  2 VIEW  COMPARISON:  03/23/2017  FINDINGS: Aortic valve replacement unchanged. Small bilateral pleural effusions unchanged. Mild progression of right lower lobe atelectasis. No left lower lobe  atelectasis.  IMPRESSION: Small bilateral effusions unchanged. Bibasilar atelectasis with mild progression on the right. Negative for pulmonary edema.   Electronically Signed   By: Franchot Gallo M.D.   On: 03/24/2017 08:16   Treatments:  CARDIOVASCULAR SURGERY OPERATIVE NOTE  03/21/2017  Surgeon:  Gaye Pollack, MD  First Assistant: Nicholes Rough,  PA-C   Preoperative Diagnosis:  Severe bicuspid aortic valve stenosis with ascending aortic aneurysm   Postoperative Diagnosis:  Same   Procedure:  1. Median Sternotomy 2. Extracorporeal circulation 3.   Replacement of the ascending aorta (hemi-arch) using a 28 mm Hemashield graft under deep hypothermic circulatory arrest 4.   Biological Bentall Procedure using a 23 mm Edwards Magna-Ease pericardial valve and a 26 mm Gelweave Valsalva graft.  Anesthesia:  General Endotracheal  03/22/2017 Analysse Quinonez [place] [id number]  Surgeon: Gaye Pollack, MD   First Assistant: Vernie Murders, RNFA  Preoperative Diagnosis: mediastinal bleeding s/p Bentall procedure.  Postoperative Diagnosis: Same   Procedure:  1.  Median Sternotomy 2. Evacuation of mediastinal hematoma 3. Ligation of bleeding thymic vessel  Anesthesia: General Endotracheal    Discharge Exam: Blood pressure 114/77, pulse 70, temperature 99 F (37.2 C), temperature source Oral, resp. rate 18, height 5\' 4"  (1.626 m), weight 59 kg (130 lb 1.6 oz), SpO2 96 %.   General appearance: alert and cooperative Neurologic: intact Heart: regular rate and rhythm, S1, S2 normal, no murmur, click, rub or gallop Lungs: clear to auscultation bilaterally Extremities: extremities normal, atraumatic, no cyanosis or edema Wound: incision looks good  Disposition: 01-Home or Self Care  Discharge Instructions    Discharge patient    Complete by:  As directed    Discharge disposition:  01-Home or Self Care   Discharge patient date:  03/26/2017      Allergies as of 03/26/2017      Reactions   Penicillins Hives   Has patient had a PCN reaction causing immediate rash, facial/tongue/throat swelling, SOB or lightheadedness with hypotension: unknown Has patient had a PCN reaction causing severe rash involving mucus membranes or skin necrosis:unknown Has patient had a PCN reaction that required hospitalization no Has patient had a PCN reaction occurring within the last 10 years: no If all of the above answers are "NO", then may proceed with Cephalosporin use.      Medication List    STOP taking these medications   CULTURELLE DIGESTIVE HEALTH Caps   doxycycline 100 MG capsule Commonly known as:  VIBRAMYCIN   ibuprofen 200 MG tablet Commonly known as:  ADVIL,MOTRIN     TAKE these medications   acetaminophen 500 MG tablet Commonly known as:  TYLENOL Take 2 tablets (1,000 mg total) by mouth every 6 (six) hours.   aspirin 325 MG EC tablet Take 1 tablet (325 mg total) by mouth daily.   multivitamin with minerals Tabs tablet Take 1 tablet by mouth daily.   sertraline 100 MG tablet Commonly known as:  ZOLOFT Take 100 mg by mouth daily.   traMADol 50 MG tablet Commonly known as:  Veatrice Bourbon  Take 1-2 tablets (50-100 mg total) by mouth every 6 (six) hours as needed for moderate pain.      Follow-up Information    Gaye Pollack, MD Follow up.   Specialty:  Cardiothoracic Surgery Why:  Your appointment is on 04/24/2017 at 3:30pm. Please arrive at 3:00pm for a chest xray located at [facility] which is on the first floor of our building.  Contact information: [address] [place] [address] [phone]        Deland Pretty, MD. Call in 1 day(s).   Specialty:  Internal Medicine Contact information: [address] [place] [address] [phone]        nursing appointment Follow up.   Why:  Your suture removal is on 7/31 at 10:00am.  Contact information: Dr. Vivi Martens  office.        Isaiah Serge, NP Follow up.   Specialties:  Cardiology, Radiology Why:  Cecilie Kicks, NP 8/9 @ 2:30PM ([place])  Contact information: [place] [address] [phone]           Signed: Elgie Collard 03/26/2017, 8:57 AM

## 2017-03-25 NOTE — Progress Notes (Signed)
DC'd EPW per MD orders and protocol, EPW ends intact, no bleeding, patient on bedrest for one hour Q15 VS, call bell in reach, will continue to monitor.   Rowe Pavy, RN

## 2017-03-25 NOTE — Progress Notes (Signed)
4 Days Post-Op Procedure(s) (LRB): MEDIASTINAL EXPLORATION FOR BLEEDING (N/A) Subjective: No complaints. Walking, eating, bowels working.  Objective: Vital signs in last 24 hours: Temp:  [98.3 F (36.8 C)-99.1 F (37.3 C)] 98.8 F (37.1 C) (07/23 0400) Pulse Rate:  [70-79] 70 (07/23 0400) Cardiac Rhythm: Normal sinus rhythm (07/23 0400) Resp:  [13-25] 14 (07/23 0400) BP: (86-128)/(61-92) 91/61 (07/23 0400) SpO2:  [93 %-98 %] 94 % (07/23 0400) Weight:  [62.8 kg (138 lb 7.2 oz)] 62.8 kg (138 lb 7.2 oz) (07/23 0600)  Hemodynamic parameters for last 24 hours:    Intake/Output from previous day: 07/22 0701 - 07/23 0700 In: 840 [P.O.:840] Out: 1550 [Urine:1550] Intake/Output this shift: No intake/output data recorded.  General appearance: alert and cooperative Neurologic: intact Heart: regular rate and rhythm, S1, S2 normal, no murmur, click, rub or gallop Lungs: diminished breath sounds bibasilar Extremities: edema mild Wound: dressing dry  Lab Results:  Recent Labs  03/23/17 0345 03/24/17 0228  WBC 8.8 10.0  HGB 10.2* 9.9*  HCT 31.6* 31.0*  PLT 97* 136*   BMET:  Recent Labs  03/24/17 0228 03/25/17 0205  NA 136 137  K 3.5 4.0  CL 102 100*  CO2 28 29  GLUCOSE 117* 102*  BUN 9 7  CREATININE 0.63 0.62  CALCIUM 8.5* 8.5*    PT/INR: No results for input(s): LABPROT, INR in the last 72 hours. ABG    Component Value Date/Time   PHART 7.375 03/22/2017 0220   HCO3 25.1 03/22/2017 0220   TCO2 23 03/22/2017 1717   ACIDBASEDEF 3.0 (H) 03/22/2017 0025   O2SAT 97.0 03/22/2017 0220   CBG (last 3)   Recent Labs  03/22/17 2002 03/23/17 0341 03/23/17 0749  GLUCAP 148* 126* 114*    Assessment/Plan: POD 4 S/p Bio-Bentall with pericardial valve and resection of ascending aortic aneurysm.  She has been hemodynamically stable in sinus rhythm. BP runs low normal in the 90's so will not continue Lopressor.  Still have volume excess with weight 11 lbs over preop.  Will give a small dose of Metolazone with lasix today. She will need to go home on diuretic for a week or so.  Bibasilar atelectasis and small effusions on chest X-ray yesterday. Continue diuresis and IS.  Awaiting bed on 4E. She can go home tomorrow if no changes.  DC pacing wires today.     LOS: 4 days    Gaye Pollack 03/25/2017

## 2017-03-26 ENCOUNTER — Other Ambulatory Visit: Payer: Self-pay | Admitting: *Deleted

## 2017-03-26 MED ORDER — ASPIRIN 325 MG PO TBEC: 325.0000 mg | DELAYED_RELEASE_TABLET | Freq: Every day | ORAL | 0 refills | Status: DC

## 2017-03-26 MED ORDER — TRAMADOL HCL 50 MG PO TABS: 50.0000 mg | ORAL_TABLET | Freq: Four times a day (QID) | ORAL | 0 refills | Status: DC | PRN

## 2017-03-26 MED ORDER — ACETAMINOPHEN 500 MG PO TABS: 1000.0000 mg | ORAL_TABLET | Freq: Four times a day (QID) | ORAL | 0 refills | Status: DC

## 2017-03-26 NOTE — Progress Notes (Signed)
CARDIAC REHAB PHASE I   Pt states she ambulated two laps around the unit this morning, no complaints, declines additional ambulation at this time, awaiting discharge. Cardiac surgery discharge education completed. Reviewed IS, sternal precautions, activity progression, exercise, heart healthy diet, daily weights and phase 2 cardiac rehab. Pt verbalized understanding. Pt agrees to phase 2 cardiac rehab referral, will send to [facility] per pt request. Pt in bed, call bell within reach.   [phone] Lenna Sciara, RN, BSN 03/26/2017 10:00 AM

## 2017-03-26 NOTE — Progress Notes (Signed)
5 Days Post-Op Procedure(s) (LRB): MEDIASTINAL EXPLORATION FOR BLEEDING (N/A) Subjective: No complaints  Objective: Vital signs in last 24 hours: Temp:  [97.6 F (36.4 C)-99 F (37.2 C)] 99 F (37.2 C) (07/24 0400) Cardiac Rhythm: Normal sinus rhythm (07/23 2000) Resp:  [15-21] 21 (07/24 0700) BP: (92-109)/(64-83) 106/72 (07/23 2319) SpO2:  [96 %-100 %] 96 % (07/23 2315) Weight:  [59 kg (130 lb 1.6 oz)] 59 kg (130 lb 1.6 oz) (07/24 0400)  Hemodynamic parameters for last 24 hours:    Intake/Output from previous day: 07/23 0701 - 07/24 0700 In: 960 [P.O.:960] Out: 3001 [Urine:3001] Intake/Output this shift: No intake/output data recorded.  General appearance: alert and cooperative Neurologic: intact Heart: regular rate and rhythm, S1, S2 normal, no murmur, click, rub or gallop Lungs: clear to auscultation bilaterally Extremities: extremities normal, atraumatic, no cyanosis or edema Wound: incision looks good  Lab Results:  Recent Labs  03/24/17 0228  WBC 10.0  HGB 9.9*  HCT 31.0*  PLT 136*   BMET:  Recent Labs  03/24/17 0228 03/25/17 0205  NA 136 137  K 3.5 4.0  CL 102 100*  CO2 28 29  GLUCOSE 117* 102*  BUN 9 7  CREATININE 0.63 0.62  CALCIUM 8.5* 8.5*    PT/INR: No results for input(s): LABPROT, INR in the last 72 hours. ABG    Component Value Date/Time   PHART 7.375 03/22/2017 0220   HCO3 25.1 03/22/2017 0220   TCO2 23 03/22/2017 1717   ACIDBASEDEF 3.0 (H) 03/22/2017 0025   O2SAT 97.0 03/22/2017 0220   CBG (last 3)  No results for input(s): GLUCAP in the last 72 hours.  Assessment/Plan: POD 5 S/P Bio-Bentall with pericardial valve and resection of ascending aortic aneurysm.  She has been hemodynamically stable in sinus rhythm. BP runs low normal in the 90's so will not continue Lopressor.  Volume excess much improved with diuresis yesterday. Weight down 8 lbs and now only 2.5 lbs over preop. She has another dose of lasix ordered this am and  will not need to go home on lasix.  She is stable to go home today. Chest tube sutures out in one week in office. I will see with CXR in 3 weeks.   LOS: 5 days    Jessica [name] 03/26/2017

## 2017-03-26 NOTE — Progress Notes (Signed)
Discharge instructions given to patient. Verbalized understanding. Waiting on daughter to pick up for discharge.

## 2017-03-27 ENCOUNTER — Telehealth (HOSPITAL_COMMUNITY): Payer: Self-pay

## 2017-03-27 NOTE — Telephone Encounter (Signed)
Patient insurance is active and benefits verified. Patient has [employer]BS - no co-payment, deductible $5000/$4149.16 has been met, out of pocket $6850/$6479.40 has been met, 20% co-insurance, no pre-authorization and no limit on visit. Passport/reference [id number].  Patient will be contacted and scheduled after their follow up appointment with the cardiologist on 04/11/17 and surgeon on 04/24/17, upon review by [facility] RN navigator.

## 2017-04-03 ENCOUNTER — Ambulatory Visit (INDEPENDENT_AMBULATORY_CARE_PROVIDER_SITE_OTHER): Payer: Self-pay

## 2017-04-03 DIAGNOSIS — Z4802 Encounter for removal of sutures: Secondary | ICD-10-CM

## 2017-04-06 ENCOUNTER — Observation Stay (HOSPITAL_COMMUNITY)
Admission: EM | Admit: 2017-04-06 | Discharge: 2017-04-07 | Disposition: A | Discharge status: Home / Self Care | Payer: BLUE CROSS/BLUE SHIELD | Attending: Internal Medicine | Admitting: Internal Medicine

## 2017-04-06 ENCOUNTER — Telehealth: Payer: Self-pay | Admitting: Thoracic Surgery (Cardiothoracic Vascular Surgery)

## 2017-04-06 ENCOUNTER — Emergency Department (HOSPITAL_COMMUNITY): Payer: BLUE CROSS/BLUE SHIELD

## 2017-04-06 ENCOUNTER — Encounter (HOSPITAL_COMMUNITY): Payer: Self-pay | Admitting: *Deleted

## 2017-04-06 DIAGNOSIS — Z87891 Personal history of nicotine dependence: Secondary | ICD-10-CM | POA: Insufficient documentation

## 2017-04-06 DIAGNOSIS — I4891 Unspecified atrial fibrillation: Secondary | ICD-10-CM | POA: Diagnosis not present

## 2017-04-06 DIAGNOSIS — R000 Tachycardia, unspecified: Secondary | ICD-10-CM | POA: Diagnosis not present

## 2017-04-06 DIAGNOSIS — R0602 Shortness of breath: Secondary | ICD-10-CM | POA: Diagnosis not present

## 2017-04-06 DIAGNOSIS — Z79899 Other long term (current) drug therapy: Secondary | ICD-10-CM | POA: Diagnosis not present

## 2017-04-06 DIAGNOSIS — I359 Nonrheumatic aortic valve disorder, unspecified: Secondary | ICD-10-CM | POA: Diagnosis present

## 2017-04-06 DIAGNOSIS — Z952 Presence of prosthetic heart valve: Secondary | ICD-10-CM

## 2017-04-06 LAB — BASIC METABOLIC PANEL
ANION GAP: 10 (ref 5–15)
BUN: 10 mg/dL (ref 6–20)
CO2: 24 mmol/L (ref 22–32)
Calcium: 9.2 mg/dL (ref 8.9–10.3)
Chloride: 106 mmol/L (ref 101–111)
Creatinine, Ser: 0.66 mg/dL (ref 0.44–1.00)
GLUCOSE: 96 mg/dL (ref 65–99)
POTASSIUM: 4.2 mmol/L (ref 3.5–5.1)
Sodium: 140 mmol/L (ref 135–145)

## 2017-04-06 LAB — I-STAT TROPONIN, ED: Troponin i, poc: 0.01 ng/mL (ref 0.00–0.08)

## 2017-04-06 LAB — MAGNESIUM
MAGNESIUM: 1.9 mg/dL (ref 1.7–2.4)
Magnesium: 2 mg/dL (ref 1.7–2.4)

## 2017-04-06 LAB — CBC
HEMATOCRIT: 32.5 % — AB (ref 36.0–46.0)
HEMOGLOBIN: 10.5 g/dL — AB (ref 12.0–15.0)
MCH: 29.5 pg (ref 26.0–34.0)
MCHC: 32.3 g/dL (ref 30.0–36.0)
MCV: 91.3 fL (ref 78.0–100.0)
Platelets: 476 10*3/uL — ABNORMAL HIGH (ref 150–400)
RBC: 3.56 MIL/uL — AB (ref 3.87–5.11)
RDW: 14.5 % (ref 11.5–15.5)
WBC: 5.7 10*3/uL (ref 4.0–10.5)

## 2017-04-06 LAB — TSH: TSH: 1.518 u[IU]/mL (ref 0.350–4.500)

## 2017-04-06 LAB — I-STAT BETA HCG BLOOD, ED (MC, WL, AP ONLY): I-stat hCG, quantitative: <5 m[IU]/mL (ref <5)

## 2017-04-06 MED ORDER — DILTIAZEM HCL 100 MG IV SOLR
5.0000 mg/h | INTRAVENOUS | Status: AC
  Administered 2017-04-06: 5 mg/h via INTRAVENOUS
  Filled 2017-04-06: qty 100

## 2017-04-06 MED ORDER — SERTRALINE HCL 100 MG PO TABS
100.0000 mg | ORAL_TABLET | Freq: Every day | ORAL | Status: DC
  Administered 2017-04-07: 100 mg via ORAL
  Filled 2017-04-06: qty 1

## 2017-04-06 MED ORDER — ACETAMINOPHEN 325 MG PO TABS
650.0000 mg | ORAL_TABLET | ORAL | Status: DC | PRN
  Administered 2017-04-06 – 2017-04-07 (×3): 650 mg via ORAL
  Filled 2017-04-06 (×3): qty 2

## 2017-04-06 MED ORDER — DILTIAZEM HCL 30 MG PO TABS
30.0000 mg | ORAL_TABLET | Freq: Four times a day (QID) | ORAL | Status: DC
  Administered 2017-04-06 – 2017-04-07 (×3): 30 mg via ORAL
  Filled 2017-04-06 (×3): qty 1

## 2017-04-06 MED ORDER — ACETAMINOPHEN 500 MG PO TABS: 1000.0000 mg | ORAL_TABLET | Freq: Four times a day (QID) | ORAL | Status: DC

## 2017-04-06 MED ORDER — APIXABAN 5 MG PO TABS
5.0000 mg | ORAL_TABLET | Freq: Two times a day (BID) | ORAL | Status: DC
  Administered 2017-04-06 – 2017-04-07 (×2): 5 mg via ORAL
  Filled 2017-04-06 (×2): qty 1

## 2017-04-06 MED ORDER — ONDANSETRON HCL 4 MG/2ML IJ SOLN: 4.0000 mg | Freq: Four times a day (QID) | INTRAMUSCULAR | Status: DC | PRN

## 2017-04-06 NOTE — H&P (Signed)
Patient ID: DONELDA MAILHOT MRN: [id number], DOB/AGE: 11-Aug-1961   Admit date: 04/06/2017   Primary Physician: Deland Pretty, MD Primary Cardiologist: Dr. Johnsie Cancel CT Surgeon: Dr. Cyndia Bent   Pt. Profile:  56 year old female who recently underwent open heart surgery for Bentall procedure (aortic valve and root replacement) on 03/21/17, discharge 03/26/17, and PAF with CHA2DS2 VASc score of 1, presenting to the ED with complaint of palpitations and found to be in atrial fibrillation with RVR. Cardiology has been asked to evaluate, at the request of Dr. Vanita Panda ED physician.   Problem List  Past Medical History:  Diagnosis Date  . Anxiety   . Aortic stenosis   . Bicuspid aortic valve    Previously seen in [place]; moderate AS and mild AR  . Chronic neck pain   . Congenital heart valve abnormality   . Dyspnea    with exertion  . Heart murmur   . Paroxysmal atrial fibrillation (HCC)   . PONV (postoperative nausea and vomiting)   . Vertigo     Past Surgical History:  Procedure Laterality Date  . abscess excision     Right thigh  . BENTALL PROCEDURE N/A 03/21/2017   Procedure: BENTALL PROCEDURE;  Surgeon: Gaye Pollack, MD;  Location: [facility] OR;  Service: Open Heart Surgery;  Laterality: N/A;  CIRC ARREST  LEFT RADIAL A-LINE  . ENDOMETRIAL ABLATION    . HERNIA REPAIR    . KNEE SURGERY     Multiple  . LAPAROSCOPY     x2  . MEDIASTINAL EXPLORATION N/A 03/21/2017   Procedure: MEDIASTINAL EXPLORATION FOR BLEEDING;  Surgeon: Gaye Pollack, MD;  Location: [facility];  Service: Thoracic;  Laterality: N/A;  . TEE WITHOUT CARDIOVERSION N/A 03/21/2017   Procedure: TRANSESOPHAGEAL ECHOCARDIOGRAM (TEE);  Surgeon: Gaye Pollack, MD;  Location: [facility];  Service: Open Heart Surgery;  Laterality: N/A;  . TUBAL LIGATION    . VENTRICULAR ANEURYSM RESECTION N/A 03/21/2017   Procedure: RESECTION OF ASCENDING ANEURYSM;  Surgeon: Gaye Pollack, MD;  Location: [facility];  Service: Open Heart Surgery;   Laterality: N/A;     Allergies  Allergies  Allergen Reactions  . Penicillins Hives    Has patient had a PCN reaction causing immediate rash, facial/tongue/throat swelling, SOB or lightheadedness with hypotension: unknown Has patient had a PCN reaction causing severe rash involving mucus membranes or skin necrosis:unknown Has patient had a PCN reaction that required hospitalization no Has patient had a PCN reaction occurring within the last 10 years: no If all of the above answers are "NO", then may proceed with Cephalosporin use.     HPI  Patient is a 56 year old female with history of bicuspid aortic valve disease with subsequent aortic stenosis as well as enlargement of the aortic root and ascending aorta who recently underwent open heart surgery for aortic valve replacement plus replacement of the ascending aorta (Bentall procedure using a 23 mm Edwards magna ease pericardial valve and a 26 mm Gelweave Valsalva graft on 03/21/2017 with Dr. Cyndia Bent). She did not require CABG as preoperative left heart catheterization showed normal coronary arteries. Left ventricular EF was also normal at 60-65%. Per discharge summary, her postop recovery was fairly unremarkable. No issues with atrial arrhythmias were noted in discharge summary, However patient reports that she does have a history of paroxysmal atrial fibrillation and noted that she was in A. fib the day she presented with her surgery but she had no issues postoperatively. Her CHA2DS2 VASc  score score is only 1 for female sex, thus she has not been on oral anticoagulation. She was discharged home on 03/26/2017.   She now presents back to the ED with a complaint of palpitations. Started this morning around 9 AM. She denies any associated chest pain, dizziness, dyspnea, syncope/near-syncope. No fever, chills, nausea, vomiting or diarrhea. UA is pending. She admits that last night she had 4 glasses of wine. This not entirely unusual for her, as she  typically drinks about 3 glasses of wine a night. On arrival to the ED, she was noted to be in A. fib with rapid ventricular response up into the 140s. BP is stable. Hgb 10.4 (9.9 at discharge). Potassium stable at 4.2. Renal function is stable with serum creatinine of 0.66 and BUN of 10.  She remains in afib. HF in the 110s. BP is stable. She is comfortable at rest.    Home Medications  Prior to Admission medications   Medication Sig Start Date End Date Taking? Authorizing Provider  acetaminophen (TYLENOL) 500 MG tablet Take 2 tablets (1,000 mg total) by mouth every 6 (six) hours. 03/26/17  Yes Conte, Tessa N, PA-C  ibuprofen (ADVIL,MOTRIN) 200 MG tablet Take 800 mg by mouth every 6 (six) hours as needed for mild pain.   Yes [provider]  Multiple Vitamin (MULTIVITAMIN WITH MINERALS) TABS tablet Take 1 tablet by mouth daily.   Yes [provider]  Scar Treatment Products [facility]) GEL Apply 1 application topically daily.   Yes [provider]  sertraline (ZOLOFT) 100 MG tablet Take 100 mg by mouth daily.   Yes [provider]  aspirin EC 325 MG EC tablet Take 1 tablet (325 mg total) by mouth daily. Patient not taking: Reported on 04/06/2017 03/26/17   Elgie Collard, PA-C  traMADol (ULTRAM) 50 MG tablet Take 1-2 tablets (50-100 mg total) by mouth every 6 (six) hours as needed for moderate pain. Patient not taking: Reported on 04/06/2017 03/26/17   Elgie Collard, PA-C    Family History  Family History  Problem Relation Age of Onset  . Hypertension Mother   . Heart attack Mother   . Hypertension Father   . Heart attack Father   . Breast cancer Paternal Grandmother     Social History  Social History   Social History  . Marital status: Married    Spouse name: N/A  . Number of children: 3  . Years of education: N/A   Occupational History  . Not on file.   Social History Main Topics  . Smoking status: Former Smoker    Quit date: 09/03/2009  .  Smokeless tobacco: Never Used     Comment: Quit using Chantix.  Marland Kitchen Alcohol use Yes     Comment: daily 2-3 glasses of wine  . Drug use: No  . Sexual activity: Not on file   Other Topics Concern  . Not on file   Social History Narrative   Moved from [place], husband is in Museum/gallery conservator.     Review of Systems General:  No chills, fever, night sweats or weight changes.  Cardiovascular:  No chest pain, dyspnea on exertion, edema, orthopnea, palpitations, paroxysmal nocturnal dyspnea. Dermatological: No rash, lesions/masses Respiratory: No cough, dyspnea Urologic: No hematuria, dysuria Abdominal:   No nausea, vomiting, diarrhea, bright red blood per rectum, melena, or hematemesis Neurologic:  No visual changes, wkns, changes in mental status. All other systems reviewed and are otherwise negative except as noted above.  Physical  Exam  Blood pressure 123/86, pulse (!) 109, temperature 98.4 F (36.9 C), temperature source Oral, resp. rate 15, SpO2 99 %.  General: Pleasant, NAD Psych: Normal affect. Neuro: Alert and oriented X 3. Moves all extremities spontaneously. HEENT: Normal  Neck: Supple without bruits or JVD. Lungs:  Resp regular and unlabored, CTA. Heart: irregularly irregular, tachy rate , 1/6 murmur at RUSB Abdomen: Soft, non-tender, non-distended, BS + x 4.  Extremities: No clubbing, cyanosis or edema. DP/PT/Radials 2+ and equal bilaterally.  Labs  Troponin [facility] of Care Test)  Recent Labs  04/06/17 1222  TROPIPOC 0.01   No results for input(s): CKTOTAL, CKMB, TROPONINI in the last 72 hours. Lab Results  Component Value Date   WBC 5.7 04/06/2017   HGB 10.5 (L) 04/06/2017   HCT 32.5 (L) 04/06/2017   MCV 91.3 04/06/2017   PLT 476 (H) 04/06/2017    Recent Labs Lab 04/06/17 1216  NA 140  K 4.2  CL 106  CO2 24  BUN 10  CREATININE 0.66  CALCIUM 9.2  GLUCOSE 96   No results found for: CHOL, HDL, LDLCALC, TRIG No results found for: DDIMER     Radiology/Studies  Dg Chest 2 View  Result Date: 04/06/2017 CLINICAL DATA:  Acute palpitations and shortness of breath today. Aortic valve replacement 2 weeks ago. EXAM: CHEST  2 VIEW COMPARISON:  03/24/2017 and prior chest radiographs FINDINGS: Upper limits normal heart size and aortic valve replacement noted. There is no evidence of focal airspace disease, pulmonary edema, suspicious pulmonary nodule/mass, pleural effusion, or pneumothorax. No acute bony abnormalities are identified. IMPRESSION: No active cardiopulmonary disease. Electronically Signed   By: Margarette Canada M.D.   On: 04/06/2017 12:58   Dg Chest 2 View  Result Date: 03/24/2017 CLINICAL DATA:  Atelectasis EXAM: CHEST  2 VIEW COMPARISON:  03/23/2017 FINDINGS: Aortic valve replacement unchanged. Small bilateral pleural effusions unchanged. Mild progression of right lower lobe atelectasis. No left lower lobe atelectasis. IMPRESSION: Small bilateral effusions unchanged. Bibasilar atelectasis with mild progression on the right. Negative for pulmonary edema. Electronically Signed   By: Franchot Gallo M.D.   On: 03/24/2017 08:16   Dg Chest 2 View  Result Date: 03/19/2017 CLINICAL DATA:  Preop EXAM: CHEST  2 VIEW COMPARISON:  None 15 2011 FINDINGS: Ascending aorta remains prominent. Normal heart size. Clear lungs. Normal pulmonary vascularity. No pneumothorax or pleural effusion. IMPRESSION: No active cardiopulmonary disease. Electronically Signed   By: Marybelle Killings M.D.   On: 03/19/2017 16:03   Dg Chest Port 1 View  Result Date: 03/23/2017 CLINICAL DATA:  Aortic valve replacement EXAM: PORTABLE CHEST 1 VIEW COMPARISON:  03/22/2017 FINDINGS: Swan-Ganz catheter removed is sheath in the SVC. Mediastinal drains removed. Aortic valve replacement again noted. Progression of bibasilar atelectasis. Negative for edema. Negative for pneumothorax IMPRESSION: Hypoventilation with increase in bibasilar atelectasis. Negative for pneumothorax. Electronically  Signed   By: Franchot Gallo M.D.   On: 03/23/2017 07:26   Dg Chest Port 1 View  Result Date: 03/22/2017 CLINICAL DATA:  Chest pain. EXAM: PORTABLE CHEST 1 VIEW COMPARISON:  March 21, 2017 FINDINGS: Endotracheal tube and nasogastric tube have been removed. Mediastinal drain and left chest tube remain in place, unchanged. Swan-Ganz catheter tip is in the main pulmonary outflow tract. Temporary pacemaker wires are attached to the right heart. No pneumothorax. There is airspace consolidation in the left lower lobe with small left pleural effusion. Right lung is clear. Heart is mildly enlarged with pulmonary vascularity within normal limits. No  adenopathy. No bone lesions. IMPRESSION: Tube and catheter positions as described without pneumothorax. Left lower lobe consolidation with small left effusion, stable. Right lung clear. Stable cardiac silhouette. Electronically Signed   By: Lowella Grip III M.D.   On: 03/22/2017 07:51   Dg Chest Port 1 View  Result Date: 03/21/2017 CLINICAL DATA:  Chest tube in place.  Status post CABG surgery. EXAM: PORTABLE CHEST 1 VIEW COMPARISON:  03/21/2017 at 1419 hours FINDINGS: Since prior exam, some hazy opacity has developed at the lung bases, greater on the left, most likely combination of small effusions and atelectasis, accentuated by the current supine technique. Lungs otherwise clear with no evidence of pulmonary edema. The endotracheal tube, right internal jugular Swan-Ganz catheter, mediastinal tube and left inferior chest tube are stable. There has been no change in the position of the nasogastric tube, which remains curled on itself at the level of the GE junction and distal esophagus. No mediastinal widening.  No convincing pneumothorax. IMPRESSION: 1. Hazy opacity has developed at the lung bases likely combination of small effusions and atelectasis. No evidence of pulmonary edema. No pneumothorax. 2. No mediastinal widening. 3. No change in the positioning of the  support apparatus. Electronically Signed   By: Lajean Manes M.D.   On: 03/21/2017 21:47   Dg Chest Port 1 View  Result Date: 03/21/2017 CLINICAL DATA:  Status post aortic valve replacement and aorto plasty. EXAM: PORTABLE CHEST 1 VIEW COMPARISON:  03/19/2017 FINDINGS: Since the prior study, cardiac surgery has been performed. The standard lines and tubes are in place, including: An endotracheal tube with its tip 3.2 cm above the carina, a right internal jugular Swan-Ganz catheter with its tip projecting in the main pulmonary artery, a mediastinal tube with another tube that projects over the left heart, likely along the posterior pericardial sac. There is also a nasogastric tube. It is folded on itself, with the tube crossing the GE junction, but the tip extending back cephalad to lie in the distal esophagus. The cardiac silhouette is normal in size and configuration. The new aortic bowel noted. There is no mediastinal widening to suggest a hematoma. Lungs are clear.  No pneumothorax. IMPRESSION: 1. No evidence of a mediastinal hematoma, pulmonary edema or pneumothorax. 2. Nasogastric tube is folded on itself as it crosses the GE junction as described above. Recommend repositioning. 3. All remaining support apparatus is well positioned. Electronically Signed   By: Lajean Manes M.D.   On: 03/21/2017 14:26    ECG  Atrial fibrillation w/ RVR 139 bpm- personally reviewed  Telemetry  Atrial fibrillation in the 110s -- personally reviewed     ASSESSMENT AND PLAN  1. Atrial fibrillation with RVR: She reports prior history paroxysmal atrial fibrillation and she recently underwent open heart surgery for aortic valve and aortic root replacement on 03/21/17. Patient reports she had no difficulties with arrhythmias immediately following surgery. She reports that she went into atrial fibrillation around 9 AM this morning. She had 4 glasses of wine last night which may be contributing. Potassium stable at 4.2.  UA is pending. White count is normal. Hemoglobin is 10.5 but up from discharge hemoglobin following surgery. Heart rate has improved in the ED however remains elevated in the 110s. Blood pressure is stable 123/86. Will discuss with M.D. plan of action. ? direct-current cardioversion however she may not be a candidate for anticoagulation given her recent surgery. Other option would be to add low dose beta blocker for rate control and follow-up as  an outpatient. Pt should reduce ETOH intake to reduce risk of recurrence.   2. S/p Bentall Procedure 03/21/17: denies CP and dyspnea.   Signed, Lyda Jester, PA-C, M[facility] 04/06/2017, 3:21 PM [place] Pager: [phone]  Cardiology Attending  Patient seen and examined. Agree with the findings of Brittainy [name], PA-C. The patient is a pleasant middle aged woman with both bicuspid aortic valve and aortic root disease who has also had a h/o atrial fib. She underwent Bentall procedure a couple of weeks ago and postop did well with no arrhythmias. She admits to driniking 4 glasses of wine last night and awoke today with palpitations and presented to the ED with atrial fib with a RVR. She feels well otherwise. She is on no AV nodal blocking drugs. Exam demonstrates a well appearing middle aged woman, NAD, with clear lungs and an IRIR rhythm and no edema. ECG with atrial fib and a RVR.  A/P 1. Recurrent atrial fib with CHADSVASC of 1. Will admit for observation and start IV cardizem and give her Eliquis 5 mg twice daily. Will plan for a DCCV tomorrow morning if she does not revert back to NSR. I would anticipate she take the Eliquis for 3-4 weeks. 2. Aortic stenosis, s/p AVR - no evidence of any postoperative surgical issues and incision appears to be healing nicely. 3. ETOH use - I have counseled her on need to limit ETOH intake to a single drink a day or less.  Mikle Bosworth.D.

## 2017-04-06 NOTE — Telephone Encounter (Signed)
      [address]       [place] [address]             [phone]      Mrs. Cavins noted her heart racing and skipping beats this morning. She checked her pulse and it has been in the [phone] range. I recommended that she go to the ED to have an EKG done.  Revonda Standard Roxan Hockey, MD [facility] [phone]

## 2017-04-06 NOTE — ED Notes (Signed)
ED Provider at bedside. 

## 2017-04-06 NOTE — ED Provider Notes (Signed)
[facility] DEPT Provider Note   CSN: [id number] Arrival date & time: 04/06/17  1147     History   Chief Complaint Chief Complaint  Patient presents with  . Palpitations  . Shortness of Breath    HPI Jessica [name] is a 56 y.o. female.  HPI Patient presents with concern of palpitations. Patient has history of atrial fibrillation, and had open heart surgery 2 weeks ago, with reported air of the aortic valve as well as an ascending aortic aneurysm. Patient notes that her atrial fibrillation was previously attributed to these lesions. She is not currently taking any rate control medication, nor any blood thinning medication. Today, in the hours prior to ED arrival the patient noticed new palpitations, mild lightheadedness, but no chest pain, no abdominal pain, no syncope. There is no other notable change from recent health status, including no fever, chills, nausea, vomiting. Patient spoke with her cardiothoracic team, was sent here for evaluation. No medication taken for pain relief.   Past Medical History:  Diagnosis Date  . Anxiety   . Aortic stenosis   . Bicuspid aortic valve    Previously seen in [place]; moderate AS and mild AR  . Chronic neck pain   . Congenital heart valve abnormality   . Dyspnea    with exertion  . Heart murmur   . Paroxysmal atrial fibrillation (HCC)   . PONV (postoperative nausea and vomiting)   . Vertigo     Patient Active Problem List   Diagnosis Date Noted  . S/P aortic valve replacement and aortoplasty 03/21/2017  . Nonrheumatic aortic valve stenosis   . Environmental allergies 07/03/2013  . Smoking 06/08/2011  . DYSPNEA 06/15/2010  . Aortic valve disorder 06/14/2010  . ATRIAL FIBRILLATION 06/14/2010  . HEART MURMUR, HX OF 06/14/2010    Past Surgical History:  Procedure Laterality Date  . abscess excision     Right thigh  . BENTALL PROCEDURE N/A 03/21/2017   Procedure: BENTALL PROCEDURE;  Surgeon: Gaye Pollack, MD;   Location[facility] OR;  Service: Open Heart Surgery;  Laterality: N/A;  CIRC ARREST  LEFT RADIAL A-LINE  . ENDOMETRIAL ABLATION    . HERNIA REPAIR    . KNEE SURGERY     Multiple  . LAPAROSCOPY     x2  . MEDIASTINAL EXPLORATION N/A 03/21/2017   Procedure: MEDIASTINAL EXPLORATION FOR BLEEDING;  Surgeon: Gaye Pollack, MD;  Location: [facility];  Service: Thoracic;  Laterality: N/A;  . TEE WITHOUT CARDIOVERSION N/A 03/21/2017   Procedure: TRANSESOPHAGEAL ECHOCARDIOGRAM (TEE);  Surgeon: Gaye Pollack, MD;  Location: [facility];  Service: Open Heart Surgery;  Laterality: N/A;  . TUBAL LIGATION    . VENTRICULAR ANEURYSM RESECTION N/A 03/21/2017   Procedure: RESECTION OF ASCENDING ANEURYSM;  Surgeon: Gaye Pollack, MD;  Location: [facility];  Service: Open Heart Surgery;  Laterality: N/A;    OB History    No data available       Home Medications    Prior to Admission medications   Medication Sig Start Date End Date Taking? Authorizing Provider  acetaminophen (TYLENOL) 500 MG tablet Take 2 tablets (1,000 mg total) by mouth every 6 (six) hours. 03/26/17  Yes Conte, Tessa N, PA-C  ibuprofen (ADVIL,MOTRIN) 200 MG tablet Take 800 mg by mouth every 6 (six) hours as needed for mild pain.   Yes [provider]  Multiple Vitamin (MULTIVITAMIN WITH MINERALS) TABS tablet Take 1 tablet by mouth daily.   Yes [provider]  Scar [employer] [facility]) GEL Apply 1 application topically daily.   Yes [provider]  sertraline (ZOLOFT) 100 MG tablet Take 100 mg by mouth daily.   Yes [provider]  aspirin EC 325 MG EC tablet Take 1 tablet (325 mg total) by mouth daily. Patient not taking: Reported on 04/06/2017 03/26/17   Elgie Collard, PA-C  traMADol (ULTRAM) 50 MG tablet Take 1-2 tablets (50-100 mg total) by mouth every 6 (six) hours as needed for moderate pain. Patient not taking: Reported on 04/06/2017 03/26/17   Elgie Collard, PA-C    Family History Family History    Problem Relation Age of Onset  . Hypertension Mother   . Heart attack Mother   . Hypertension Father   . Heart attack Father   . Breast cancer Paternal Grandmother     Social History Social History  Substance Use Topics  . Smoking status: Former Smoker    Quit date: 09/03/2009  . Smokeless tobacco: Never Used     Comment: Quit using Chantix.  Marland Kitchen Alcohol use Yes     Comment: daily 2-3 glasses of wine     Allergies   Penicillins   Review of Systems Review of Systems  Constitutional:       Per HPI, otherwise negative  HENT:       Per HPI, otherwise negative  Respiratory:       Per HPI, otherwise negative  Cardiovascular:       Per HPI, otherwise negative  Gastrointestinal: Negative for vomiting.  Endocrine:       Negative aside from HPI  Genitourinary:       Neg aside from HPI   Musculoskeletal:       Per HPI, otherwise negative  Skin: Negative.   Neurological: Negative for syncope.     Physical Exam Updated Vital Signs BP 117/85   Pulse 98   Temp 98.4 F (36.9 C) (Oral)   Resp 13   SpO2 100%   Physical Exam  Constitutional: She is oriented to person, place, and time. She appears well-developed and well-nourished. No distress.  HENT:  Head: Normocephalic and atraumatic.  Eyes: Conjunctivae and EOM are normal.  Cardiovascular: An irregularly irregular rhythm present. Tachycardia present.   Pulmonary/Chest: Effort normal and breath sounds normal. No stridor. No respiratory distress.  Chest wall mildly tender to palpation, no abnormal healing  Abdominal: She exhibits no distension.  Musculoskeletal: She exhibits no edema.  Neurological: She is alert and oriented to person, place, and time. No cranial nerve deficit.  Skin: Skin is warm and dry.  Psychiatric: She has a normal mood and affect.  Nursing note and vitals reviewed.    ED Treatments / Results  Labs (all labs ordered are listed, but only abnormal results are displayed) Labs Reviewed  CBC -  Abnormal; Notable for the following:       Result Value   RBC 3.56 (*)    Hemoglobin 10.5 (*)    HCT 32.5 (*)    Platelets 476 (*)    All other components within normal limits  BASIC METABOLIC PANEL  MAGNESIUM  I-STAT TROPONIN, ED  I-STAT BETA HCG BLOOD, ED ([facility], [facility], [facility] ONLY)    EKG  EKG Interpretation  Date/Time:  Saturday April 06 2017 11:53:47 EDT Ventricular Rate:  139 PR Interval:    QRS Duration: 82 QT Interval:  292 QTC Calculation: 444 R Axis:   73 Text Interpretation:  Atrial fibrillation with rapid ventricular response ST-t  wave abnormality Abnormal ekg Confirmed by Carmin Muskrat [phone]) on 04/06/2017 12:57:08 PM       Radiology Dg Chest 2 View  Result Date: 04/06/2017 CLINICAL DATA:  Acute palpitations and shortness of breath today. Aortic valve replacement 2 weeks ago. EXAM: CHEST  2 VIEW COMPARISON:  03/24/2017 and prior chest radiographs FINDINGS: Upper limits normal heart size and aortic valve replacement noted. There is no evidence of focal airspace disease, pulmonary edema, suspicious pulmonary nodule/mass, pleural effusion, or pneumothorax. No acute bony abnormalities are identified. IMPRESSION: No active cardiopulmonary disease. Electronically Signed   By: Margarette Canada M.D.   On: 04/06/2017 12:58    Procedures Procedures (including critical care time)  After the initial evaluation, patient was in no distress. I discussed initial findings with her, and subsequently with our cardiothoracic team, and with our cardiology team.  4:19 PM Patient remained in no distress, awake, alert. Atrial fibrillation is persistent, though mild. No new complaints. Patient is awaiting evaluation from our cardiology team.    This patients CHA2DS2-VASc Score and unadjusted Ischemic Stroke Rate (% per year) is equal to 2.2 % stroke rate/year from a score of 2  Above score calculated as 1 point each if present [CHF, HTN, DM, Vascular=MI/PAD/Aortic Plaque, Age if 65-74, or  Female] Above score calculated as 2 points each if present [Age > 75, or Stroke/TIA/TE]     Initial Impression / Assessment and Plan / ED Course  I have reviewed the triage vital signs and the nursing notes.  Pertinent labs & imaging results that were available during my care of the patient were reviewed by me and considered in my medical decision making (see chart for details).  This pleasant female presents after recent cardiothoracic surgery for aortic valve and aortic aneurysm repair, now with ongoing A. fib. Patient does have history of atrial fibrillation, but no history of prior rate control, nor anticoagulation given the presumed etiology, which was recently surgically addressed. Here the patient is awake and alert, otherwise without substantial complaints, seemingly recovering well from her surgery. With concern for persistent A. fib after intervention, I discussed her case with our cardiothoracic surgeon and our cardiology team.  On sign-out, dispo is pending cardiology eval.  Final Clinical Impressions(s) / ED Diagnoses  Atrial Fibrillation   Carmin Muskrat, MD 04/06/17 1622

## 2017-04-06 NOTE — Progress Notes (Signed)
      [address]       [place] [address]             [phone]      Mrs. Talamante called this AM when she noted her heart was racing and also "skipping." She had a Bentall procedure on 7/00 complicated by bleeding necessitating a return to the OR. A tissue valve was used for the AVR. Her postop course was otherwise uncomplicated and she was discharged on day 5. She was doing well with minimal discomfort, only using Advil for pain.   BP 123/86   Pulse (!) 109   Temp 98.4 F (36.9 C) (Oral)   Resp 15   SpO2 99%   She is in atrial fibrillation with RVR. She is tolerating this well and is minimally symptomatic at this point. She has been seen by Cardiology but there is not yet a definitive plan.  She is now 2 weeks out from surgery, so bleeding risk with anticoagulation is low. I think benefits of anticoagulation outweigh the risk of bleeding.  Revonda Standard Roxan Hockey, MD [facility] [phone]

## 2017-04-06 NOTE — ED Triage Notes (Signed)
Pt reports heart surgery 2 weeks ago, woke up this am with palpitations and mild sob. Afib on EKG, rate 100-140.

## 2017-04-07 DIAGNOSIS — I4891 Unspecified atrial fibrillation: Secondary | ICD-10-CM | POA: Diagnosis not present

## 2017-04-07 LAB — BASIC METABOLIC PANEL
ANION GAP: 9 (ref 5–15)
BUN: 9 mg/dL (ref 6–20)
CO2: 23 mmol/L (ref 22–32)
Calcium: 8.7 mg/dL — ABNORMAL LOW (ref 8.9–10.3)
Chloride: 104 mmol/L (ref 101–111)
Creatinine, Ser: 0.64 mg/dL (ref 0.44–1.00)
GFR calc Af Amer: >60 mL/min (ref >60)
GLUCOSE: 96 mg/dL (ref 65–99)
POTASSIUM: 3.9 mmol/L (ref 3.5–5.1)
Sodium: 136 mmol/L (ref 135–145)

## 2017-04-07 LAB — CBC
HEMATOCRIT: 31.2 % — AB (ref 36.0–46.0)
HEMOGLOBIN: 10 g/dL — AB (ref 12.0–15.0)
MCH: 29 pg (ref 26.0–34.0)
MCHC: 32.1 g/dL (ref 30.0–36.0)
MCV: 90.4 fL (ref 78.0–100.0)
Platelets: 396 10*3/uL (ref 150–400)
RBC: 3.45 MIL/uL — ABNORMAL LOW (ref 3.87–5.11)
RDW: 14.5 % (ref 11.5–15.5)
WBC: 6.5 10*3/uL (ref 4.0–10.5)

## 2017-04-07 LAB — HIV ANTIBODY (ROUTINE TESTING W REFLEX): HIV Screen 4th Generation wRfx: NONREACTIVE

## 2017-04-07 MED ORDER — METOPROLOL TARTRATE 25 MG PO TABS: 25.0000 mg | ORAL_TABLET | ORAL | 3 refills | Status: DC | PRN

## 2017-04-07 MED ORDER — APIXABAN 5 MG PO TABS: 5.0000 mg | ORAL_TABLET | Freq: Two times a day (BID) | ORAL | 0 refills | Status: DC

## 2017-04-07 NOTE — Progress Notes (Signed)
Discussed with patient discharge instructions, she verbalized agreement and understanding.  Patient to go home with all belongings to go home in private vehicle. 

## 2017-04-07 NOTE — Progress Notes (Signed)
Progress Note  Patient Name: Jessica [name] Date of Encounter: 04/07/2017  Primary Cardiologist: Johnsie Cancel  Subjective   No chest pain or sob or palpitations. She reverted back to NSR last night.  Inpatient Medications    Scheduled Meds: . apixaban  5 mg Oral BID  . diltiazem  30 mg Oral Q6H  . sertraline  100 mg Oral Daily   Continuous Infusions:  PRN Meds: acetaminophen, ondansetron (ZOFRAN) IV   Vital Signs    Vitals:   04/06/17 1819 04/06/17 1935 04/06/17 2202 04/07/17 0500  BP: 111/80 103/81 111/83 118/73  Pulse: (!) 112 86 79   Resp: 20 17 19 16   Temp: 98.4 F (36.9 C)  98.4 F (36.9 C) 98.2 F (36.8 C)  TempSrc: Oral  Oral Oral  SpO2: 98% 97% 97% 97%  Weight: 127 lb (57.6 kg)   128 lb 1.6 oz (58.1 kg)  Height: 5\' 4"  (1.626 m)       Intake/Output Summary (Last 24 hours) at 04/07/17 1042 Last data filed at 04/06/17 2300  Gross per 24 hour  Intake            382.5 ml  Output                0 ml  Net            382.5 ml   Filed Weights   04/06/17 1819 04/07/17 0500  Weight: 127 lb (57.6 kg) 128 lb 1.6 oz (58.1 kg)    Telemetry    Atrial fib now NSR - Personally Reviewed  ECG    NSR - Personally Reviewed  Physical Exam   GEN: No acute distress.   Neck: 6 cm JVD Cardiac: RRR, no murmurs, rubs, or gallops.  Respiratory: Clear to auscultation bilaterally. GI: Soft, nontender, non-distended  MS: No edema; No deformity. Neuro:  Nonfocal  Psych: Normal affect   Labs    Chemistry Recent Labs Lab 04/06/17 1216 04/07/17 0225  NA 140 136  K 4.2 3.9  CL 106 104  CO2 24 23  GLUCOSE 96 96  BUN 10 9  CREATININE 0.66 0.64  CALCIUM 9.2 8.7*  GFRNONAA >60 >60  GFRAA >60 >60  ANIONGAP 10 9     Hematology Recent Labs Lab 04/06/17 1216 04/07/17 0225  WBC 5.7 6.5  RBC 3.56* 3.45*  HGB 10.5* 10.0*  HCT 32.5* 31.2*  MCV 91.3 90.4  MCH 29.5 29.0  MCHC 32.3 32.1  RDW 14.5 14.5  PLT 476* 396    Cardiac EnzymesNo results for input(s):  TROPONINI in the last 168 hours.  Recent Labs Lab 04/06/17 1222  TROPIPOC 0.01     BNPNo results for input(s): BNP, PROBNP in the last 168 hours.   DDimer No results for input(s): DDIMER in the last 168 hours.   Radiology    Dg Chest 2 View  Result Date: 04/06/2017 CLINICAL DATA:  Acute palpitations and shortness of breath today. Aortic valve replacement 2 weeks ago. EXAM: CHEST  2 VIEW COMPARISON:  03/24/2017 and prior chest radiographs FINDINGS: Upper limits normal heart size and aortic valve replacement noted. There is no evidence of focal airspace disease, pulmonary edema, suspicious pulmonary nodule/mass, pleural effusion, or pneumothorax. No acute bony abnormalities are identified. IMPRESSION: No active cardiopulmonary disease. Electronically Signed   By: Margarette Canada M.D.   On: 04/06/2017 12:58    Cardiac Studies   none  Patient Profile     56 y.o. female admitted with PAF with  a RVR, after AVR 2 weeks ago, now back to NSR.  Assessment & Plan    1. PAF - she has reverted back to NSR. I will give her metoprolol to take as needed for recurrent palpitations. I would like her to take Eliquis for 3 weeks. 2. S/p AVR/Bentall - she is doing well from this and exam demonstrates no significant murmur.   Signed, Cristopher Peru, MD  04/07/2017, 10:42 AM  Patient ID: Jessica [name], female   DOB: [birth year], 56 y.o.   MRN: [id number]

## 2017-04-07 NOTE — Discharge Summary (Signed)
Discharge Summary    Patient ID: Jessica [name],  MRN: [id number], DOB/AGE: [birth year] 56 y.o.  Admit date: 04/06/2017 Discharge date: 04/07/2017  Primary Care Provider: Deland Pretty Primary Cardiologist: Dr. Johnsie Cancel    Discharge Diagnoses    Principal Problem:   Atrial fibrillation with RVR [facility]) Active Problems:   Aortic valve disorder   S/P aortic valve replacement and aortoplasty   Allergies Allergies  Allergen Reactions  . Penicillins Hives    Has patient had a PCN reaction causing immediate rash, facial/tongue/throat swelling, SOB or lightheadedness with hypotension: unknown Has patient had a PCN reaction causing severe rash involving mucus membranes or skin necrosis:unknown Has patient had a PCN reaction that required hospitalization no Has patient had a PCN reaction occurring within the last 10 years: no If all of the above answers are "NO", then may proceed with Cephalosporin use.      History of Present Illness    56 year old female who recently underwent open heart surgery for Bentall procedure (aortic valve and root replacement) on 03/21/17, discharge 03/26/17, and PAF with CHA2DS2 VASc score of 1 who presented to the [facility] ED on 04/06/17 with complaints of palpitations and found to be in atrial fibrillation with RVR.   She has a history of bicuspid aortic valve disease with subsequent aortic stenosis as well as enlargement of the aortic root and ascending aorta who recently underwent open heart surgery for aortic valve replacement plus replacement of the ascending aorta (Bentall procedure using a 23 mm Edwards magna ease pericardial valve and a 26 mm Gelweave Valsalva graft on 03/21/2017 with Dr. Cyndia Bent). She did not require CABG as preoperative left heart catheterization showed normal coronary arteries. Left ventricular EF was also normal at 60-65%. Per discharge summary, her postop recovery was fairly unremarkable. No issues with atrial arrhythmias were noted in  discharge summary, However patient reports that she does have a history of paroxysmal atrial fibrillation and noted that she was in A. fib the day she presented with her surgery but she had no issues postoperatively. Her CHA2DS2 VASc score score is only 1 for female sex, thus she has not been on oral anticoagulation. She was discharged home on 03/26/2017.   She then presented back to the ED with a complaint of palpitations. Started 8/4 morning around 9 AM. She denies any associated chest pain, dizziness, dyspnea, syncope/near-syncope. No fever, chills, nausea, vomiting or diarrhea. She admits that the previous night she had 4 glasses of wine. This not entirely unusual for her, as she typically drinks about 3 glasses of wine a night. On arrival to the ED, she was noted to be in A. fib with rapid ventricular response up into the 140s. She was admitted overnight for observation and rate control.   Hospital Course     Consultants: none  1. Recurrent atrial fib with RVR: CHADSVASC of 1. TSH normal. She reverted back to sinus on IV cardizem. She was started on Eliquis 5 mg twice daily. Plan is for her to take Eliquis for 3-4 weeks. We will also Rx PRN Lopressor for palpitations.   2. Aortic stenosis, s/p AVR: no evidence of any postoperative surgical issues and incision appears to be healing nicely. She was discharged on ASA 325mg  daily after surgery however, the patient wasn't actually taking this because she was taking high doses of ibuprofen. I will discontinue all asprin now that she is on Eliquis short term. When she finishes her 3-4 week course of Eliquis,  we may want to resume ASA 81mg  vs 325mg  daily, but will defer to her cardiac surgeon / primary cardiologist.   3. ETOH use: we have counseled her on need to limit ETOH intake to a single drink a day or less.   The patient has had an uncomplicated hospital course and is recovering well. She has been seen by Dr. Lovena Le today and deemed ready for  discharge home. All follow-up appointments have been scheduled. Discharge medications are listed below.  _____________  Discharge Vitals Blood pressure 118/73, pulse 79, temperature 98.2 F (36.8 C), temperature source Oral, resp. rate 16, height 5\' 4"  (1.626 m), weight 128 lb 1.6 oz (58.1 kg), SpO2 97 %.  Filed Weights   04/06/17 1819 04/07/17 0500  Weight: 127 lb (57.6 kg) 128 lb 1.6 oz (58.1 kg)    Labs & Radiologic Studies     CBC  Recent Labs  04/06/17 1216 04/07/17 0225  WBC 5.7 6.5  HGB 10.5* 10.0*  HCT 32.5* 31.2*  MCV 91.3 90.4  PLT 476* 315   Basic Metabolic Panel  Recent Labs  04/06/17 1216 04/06/17 1944 04/07/17 0225  NA 140  --  136  K 4.2  --  3.9  CL 106  --  104  CO2 24  --  23  GLUCOSE 96  --  96  BUN 10  --  9  CREATININE 0.66  --  0.64  CALCIUM 9.2  --  8.7*  MG 1.9 2.0  --    Liver Function Tests No results for input(s): AST, ALT, ALKPHOS, BILITOT, PROT, ALBUMIN in the last 72 hours. No results for input(s): LIPASE, AMYLASE in the last 72 hours. Cardiac Enzymes No results for input(s): CKTOTAL, CKMB, CKMBINDEX, TROPONINI in the last 72 hours. BNP Invalid input(s): POCBNP D-Dimer No results for input(s): DDIMER in the last 72 hours. Hemoglobin A1C No results for input(s): HGBA1C in the last 72 hours. Fasting Lipid Panel No results for input(s): CHOL, HDL, LDLCALC, TRIG, CHOLHDL, LDLDIRECT in the last 72 hours. Thyroid Function Tests  Recent Labs  04/06/17 1944  TSH 1.518    Dg Chest 2 View  Result Date: 04/06/2017 CLINICAL DATA:  Acute palpitations and shortness of breath today. Aortic valve replacement 2 weeks ago. EXAM: CHEST  2 VIEW COMPARISON:  03/24/2017 and prior chest radiographs FINDINGS: Upper limits normal heart size and aortic valve replacement noted. There is no evidence of focal airspace disease, pulmonary edema, suspicious pulmonary nodule/mass, pleural effusion, or pneumothorax. No acute bony abnormalities are  identified. IMPRESSION: No active cardiopulmonary disease. Electronically Signed   By: Margarette Canada M.D.   On: 04/06/2017 12:58   Dg Chest 2 View  Result Date: 03/24/2017 CLINICAL DATA:  Atelectasis EXAM: CHEST  2 VIEW COMPARISON:  03/23/2017 FINDINGS: Aortic valve replacement unchanged. Small bilateral pleural effusions unchanged. Mild progression of right lower lobe atelectasis. No left lower lobe atelectasis. IMPRESSION: Small bilateral effusions unchanged. Bibasilar atelectasis with mild progression on the right. Negative for pulmonary edema. Electronically Signed   By: Franchot Gallo M.D.   On: 03/24/2017 08:16   Dg Chest 2 View  Result Date: 03/19/2017 CLINICAL DATA:  Preop EXAM: CHEST  2 VIEW COMPARISON:  None 15 2011 FINDINGS: Ascending aorta remains prominent. Normal heart size. Clear lungs. Normal pulmonary vascularity. No pneumothorax or pleural effusion. IMPRESSION: No active cardiopulmonary disease. Electronically Signed   By: Marybelle Killings M.D.   On: 03/19/2017 16:03   Dg Chest Port 1 View  Result Date:  03/23/2017 CLINICAL DATA:  Aortic valve replacement EXAM: PORTABLE CHEST 1 VIEW COMPARISON:  03/22/2017 FINDINGS: Swan-Ganz catheter removed is sheath in the SVC. Mediastinal drains removed. Aortic valve replacement again noted. Progression of bibasilar atelectasis. Negative for edema. Negative for pneumothorax IMPRESSION: Hypoventilation with increase in bibasilar atelectasis. Negative for pneumothorax. Electronically Signed   By: Franchot Gallo M.D.   On: 03/23/2017 07:26   Dg Chest Port 1 View  Result Date: 03/22/2017 CLINICAL DATA:  Chest pain. EXAM: PORTABLE CHEST 1 VIEW COMPARISON:  March 21, 2017 FINDINGS: Endotracheal tube and nasogastric tube have been removed. Mediastinal drain and left chest tube remain in place, unchanged. Swan-Ganz catheter tip is in the main pulmonary outflow tract. Temporary pacemaker wires are attached to the right heart. No pneumothorax. There is airspace  consolidation in the left lower lobe with small left pleural effusion. Right lung is clear. Heart is mildly enlarged with pulmonary vascularity within normal limits. No adenopathy. No bone lesions. IMPRESSION: Tube and catheter positions as described without pneumothorax. Left lower lobe consolidation with small left effusion, stable. Right lung clear. Stable cardiac silhouette. Electronically Signed   By: Lowella Grip III M.D.   On: 03/22/2017 07:51   Dg Chest Port 1 View  Result Date: 03/21/2017 CLINICAL DATA:  Chest tube in place.  Status post CABG surgery. EXAM: PORTABLE CHEST 1 VIEW COMPARISON:  03/21/2017 at 1419 hours FINDINGS: Since prior exam, some hazy opacity has developed at the lung bases, greater on the left, most likely combination of small effusions and atelectasis, accentuated by the current supine technique. Lungs otherwise clear with no evidence of pulmonary edema. The endotracheal tube, right internal jugular Swan-Ganz catheter, mediastinal tube and left inferior chest tube are stable. There has been no change in the position of the nasogastric tube, which remains curled on itself at the level of the GE junction and distal esophagus. No mediastinal widening.  No convincing pneumothorax. IMPRESSION: 1. Hazy opacity has developed at the lung bases likely combination of small effusions and atelectasis. No evidence of pulmonary edema. No pneumothorax. 2. No mediastinal widening. 3. No change in the positioning of the support apparatus. Electronically Signed   By: Lajean Manes M.D.   On: 03/21/2017 21:47   Dg Chest Port 1 View  Result Date: 03/21/2017 CLINICAL DATA:  Status post aortic valve replacement and aorto plasty. EXAM: PORTABLE CHEST 1 VIEW COMPARISON:  03/19/2017 FINDINGS: Since the prior study, cardiac surgery has been performed. The standard lines and tubes are in place, including: An endotracheal tube with its tip 3.2 cm above the carina, a right internal jugular Swan-Ganz  catheter with its tip projecting in the main pulmonary artery, a mediastinal tube with another tube that projects over the left heart, likely along the posterior pericardial sac. There is also a nasogastric tube. It is folded on itself, with the tube crossing the GE junction, but the tip extending back cephalad to lie in the distal esophagus. The cardiac silhouette is normal in size and configuration. The new aortic bowel noted. There is no mediastinal widening to suggest a hematoma. Lungs are clear.  No pneumothorax. IMPRESSION: 1. No evidence of a mediastinal hematoma, pulmonary edema or pneumothorax. 2. Nasogastric tube is folded on itself as it crosses the GE junction as described above. Recommend repositioning. 3. All remaining support apparatus is well positioned. Electronically Signed   By: Lajean Manes M.D.   On: 03/21/2017 14:26     Diagnostic Studies/Procedures    none _____________  Disposition   Pt is being discharged home today in good condition.  Follow-up Plans & Appointments    Follow-up Information    Aubrielle, [place], NP. Go on 04/11/2017.   Specialties:  Cardiology, Radiology Why:  @ 2:30 pm  Contact information: [place] [address] [phone]          Discharge Instructions    Amb referral to AFIB Clinic    Complete by:  As directed       Discharge Medications     Medication List    STOP taking these medications   aspirin 325 MG EC tablet   ibuprofen 200 MG tablet Commonly known as:  ADVIL,MOTRIN   traMADol 50 MG tablet Commonly known as:  ULTRAM     TAKE these medications   acetaminophen 500 MG tablet Commonly known as:  TYLENOL Take 2 tablets (1,000 mg total) by mouth every 6 (six) hours.   apixaban 5 MG Tabs tablet Commonly known as:  ELIQUIS Take 1 tablet (5 mg total) by mouth 2 (two) times daily.   MEDERMA Gel Apply 1 application topically daily.   metoprolol tartrate 25 MG tablet Commonly known as:   LOPRESSOR Take 1 tablet (25 mg total) by mouth as needed (palpitations).   multivitamin with minerals Tabs tablet Take 1 tablet by mouth daily.   sertraline 100 MG tablet Commonly known as:  ZOLOFT Take 100 mg by mouth daily.         Outstanding Labs/Studies   none  Duration of Discharge Encounter   Greater than 30 minutes including physician time.  Signed, Angelena Form PA-C 04/07/2017, 11:40 AM  EP Attending  Patient seen and examined. Agree with above. She is stable for DC. See my note as well.  Mikle Bosworth.D.

## 2017-04-07 NOTE — Progress Notes (Signed)
Pt converted to NSR; EKG obtained.  Cardiology Fellow notified and orders for oral Cardizem taken.  First dose given and Cardizem gtt stopped one hour later.  Pt states she feels better and "can tell she is back in a normal heart rhythm".  Will continue to monitor.

## 2017-04-07 NOTE — Progress Notes (Signed)
      [address]       [place] [address]             [phone]      No complaints, anxious to go home  Converted to SR with cardizem  BP 118/73 (BP Location: Right Arm)   Pulse 79   Temp 98.2 F (36.8 C) (Oral)   Resp 16   Ht 5\' 4"  (1.626 m)   Wt 128 lb 1.6 oz (58.1 kg)   SpO2 97%   BMI 21.99 kg/m    Intake/Output Summary (Last 24 hours) at 04/07/17 1108 Last data filed at 04/06/17 2300  Gross per 24 hour  Intake            382.5 ml  Output                0 ml  Net            382.5 ml   Going home today Will remain on cardiazem. Dr. Lovena Le starting Eliquis for 3 weeks  Remo Lipps C. Roxan Hockey, MD [facility] [phone]

## 2017-04-07 NOTE — Care Management Note (Signed)
Case Management Note  Patient Details  Name: BLESSING ZAUCHA MRN: [id number] Date of Birth: 19-Nov-1960  Subjective/Objective: 56 y.o. s/p  AVR who developed AFib with RVR starting Eliquis. CM delivered 30 day free card and instructed to see Cardiologist/PCP for any prior auth need for prolonged use. Pt has no other cm needs or questions at this time.                   Action/Plan:CM will sign off for now but will be available should additional discharge needs arise or disposition change.    Expected Discharge Date:                  Expected Discharge Plan:  Home/Self Care  In-House Referral:  NA  Discharge planning Services  CM Consult, Medication Assistance  Post Acute Care Choice:  NA Choice offered to:  Patient  DME Arranged:  N/A DME Agency:  NA  HH Arranged:  NA HH Agency:  NA  Status of Service:  Completed, signed off  If discussed at [place] of Stay Meetings, dates discussed:    Additional Comments:  Delrae Sawyers, RN 04/07/2017, 10:35 AM

## 2017-04-10 ENCOUNTER — Other Ambulatory Visit: Payer: Self-pay

## 2017-04-11 ENCOUNTER — Ambulatory Visit (INDEPENDENT_AMBULATORY_CARE_PROVIDER_SITE_OTHER): Payer: BLUE CROSS/BLUE SHIELD | Admitting: Cardiology

## 2017-04-11 ENCOUNTER — Encounter: Payer: Self-pay | Admitting: Cardiology

## 2017-04-11 VITALS — BP 126/80 | HR 80 | Ht 64.0 in | Wt 127.0 lb

## 2017-04-11 DIAGNOSIS — I359 Nonrheumatic aortic valve disorder, unspecified: Secondary | ICD-10-CM | POA: Diagnosis not present

## 2017-04-11 DIAGNOSIS — I350 Nonrheumatic aortic (valve) stenosis: Secondary | ICD-10-CM

## 2017-04-11 DIAGNOSIS — I481 Persistent atrial fibrillation: Secondary | ICD-10-CM | POA: Diagnosis not present

## 2017-04-11 DIAGNOSIS — I4819 Other persistent atrial fibrillation: Secondary | ICD-10-CM

## 2017-04-11 NOTE — Patient Instructions (Addendum)
Medication Instructions:  Your physician recommends that you continue on your current medications as directed. Please refer to the Current Medication list given to you today.   Labwork: None Ordered   Testing/Procedures: Your physician has requested that you have an echocardiogram. Echocardiography is a painless test that uses sound waves to create images of your heart. It provides your doctor with information about the size and shape of your heart and how well your heart's chambers and valves are working. This procedure takes approximately one hour. There are no restrictions for this procedure.    Follow-Up: Your physician recommends that you schedule a follow-up appointment in: 2-3 months with Dr. Johnsie Cancel.  Any Other Special Instructions Will Be Listed Below (If Applicable).     If you need a refill on your cardiac medications before your next appointment, please call your pharmacy.

## 2017-04-11 NOTE — Progress Notes (Signed)
Cardiology Office Note   Date:  04/11/2017   ID:  Jessica [name], DOB 26-Sep-1960, MRN [id number]  PCP:  Deland Pretty, MD  Cardiologist:  Dr. Johnsie Cancel    Chief Complaint  Patient presents with  . Hospitalization Follow-up      History of Present Illness: Jessica [name] is a 56 y.o. female who presents for post hospitalization  Pt recently underwent open heart surgery for Bentall procedure (aortic valve and root replacement) on 03/21/17, discharge 03/26/17, and PAF with CHA2DS2 VASc score of 1 who presented to the [facility] ED on 04/06/17 with complaints of palpitations and found to be in atrial fibrillation with RVR.   history of bicuspid aortic valve disease with subsequent aortic stenosis as well as enlargement of the aortic root and ascending aorta who recently underwent open heart surgery for aortic valve replacement plus replacement of the ascending aorta (Bentall procedure using a 23 mm Edwards magna ease pericardial valve and a 26 mm Gelweave Valsalva graft on 03/21/2017 with Dr. Cyndia Bent). She did not require CABG as preoperative left heart catheterization showed normal coronary arteries. Left ventricularEF was also normal at 60-65%. Perdischarge summary, her postop recovery was fairly unremarkable. No issues with atrial arrhythmias were noted indischarge summary, However patient reports that she does have a history of paroxysmal atrial fibrillation and noted that she was in A. fib the day she presented with her surgery but she had no issues postoperatively. Her CHA2DS2 VASc score score is only 1 for female sex, Gibson Ramp has not been on oral anticoagulation.She was discharged home on 03/26/2017.  She was started on Eliquis 5 mg twice daily. Plan is for her to take Eliquis for 3-4 weeks. We will also Rx PRN Lopressor for palpitations. ASA was stopped at discharge. She will need to go back on ASA when off Eliquis.    She is drinking ETOH but has decreased.  Recommended no more than 2  glasses per day especially while on Eliquis.  Today she is doing well, no chest pain except incisional pain. No SOB, no edema.  No awareness of a fib and she has app on phone that shows rhythm.  She is walking 10,000 steps per day.  Eating healthy.  Labs in the hospital were stable, still with mild anemia.    Past Medical History:  Diagnosis Date  . Anxiety   . Aortic stenosis   . Bicuspid aortic valve    Previously seen in [place]; moderate AS and mild AR  . Chronic neck pain   . Congenital heart valve abnormality   . Dyspnea    with exertion  . Heart murmur   . Paroxysmal atrial fibrillation (HCC)   . PONV (postoperative nausea and vomiting)   . Vertigo     Past Surgical History:  Procedure Laterality Date  . abscess excision     Right thigh  . BENTALL PROCEDURE N/A 03/21/2017   Procedure: BENTALL PROCEDURE;  Surgeon: Gaye Pollack, MD;  Location: [facility] OR;  Service: Open Heart Surgery;  Laterality: N/A;  CIRC ARREST  LEFT RADIAL A-LINE  . ENDOMETRIAL ABLATION    . HERNIA REPAIR    . KNEE SURGERY     Multiple  . LAPAROSCOPY     x2  . MEDIASTINAL EXPLORATION N/A 03/21/2017   Procedure: MEDIASTINAL EXPLORATION FOR BLEEDING;  Surgeon: Gaye Pollack, MD;  Location: [place];  Service: Thoracic;  Laterality: N/A;  . TEE WITHOUT CARDIOVERSION N/A 03/21/2017   Procedure: TRANSESOPHAGEAL ECHOCARDIOGRAM (TEE);  Surgeon: Gaye Pollack, MD;  Location: [place];  Service: Open Heart Surgery;  Laterality: N/A;  . TUBAL LIGATION    . VENTRICULAR ANEURYSM RESECTION N/A 03/21/2017   Procedure: RESECTION OF ASCENDING ANEURYSM;  Surgeon: Gaye Pollack, MD;  Location: [place];  Service: Open Heart Surgery;  Laterality: N/A;     Current Outpatient Prescriptions  Medication Sig Dispense Refill  . acetaminophen (TYLENOL) 500 MG tablet Take 2 tablets (1,000 mg total) by mouth every 6 (six) hours. 30 tablet 0  . apixaban (ELIQUIS) 5 MG TABS tablet Take 1 tablet (5 mg total) by mouth 2 (two) times  daily. 60 tablet 0  . metoprolol tartrate (LOPRESSOR) 25 MG tablet Take 1 tablet (25 mg total) by mouth as needed (palpitations). 30 tablet 3  . Multiple Vitamin (MULTIVITAMIN WITH MINERALS) TABS tablet Take 1 tablet by mouth daily.    . Scar Treatment Products (MEDERMA) GEL Apply 1 application topically daily.    . sertraline (ZOLOFT) 100 MG tablet Take 100 mg by mouth daily.     No current facility-administered medications for this visit.     Allergies:   Penicillins    Social History:  The patient  reports that she quit smoking about 7 years ago. She has never used smokeless tobacco. She reports that she drinks alcohol. She reports that she does not use drugs.   Family History:  The patient's family history includes Breast cancer in her paternal grandmother; Heart attack in her father and mother; Hypertension in her father and mother.    ROS:  General:no colds or fevers, + weight changesby 3 lbs Skin:no rashes or ulcers HEENT:no blurred vision, no congestion CV:see HPI PUL:see HPI GI:no diarrhea constipation or melena, no indigestion GU:no hematuria, no dysuria MS:no joint pain, no claudication Neuro:no syncope, no lightheadedness Endo:no diabetes, no thyroid disease  Wt Readings from Last 3 Encounters:  04/11/17 127 lb (57.6 kg)  04/07/17 128 lb 1.6 oz (58.1 kg)  03/26/17 130 lb 1.6 oz (59 kg)     PHYSICAL EXAM: VS:  BP 126/80   Pulse 80   Ht 5\' 4"  (1.626 m)   Wt 127 lb (57.6 kg)   SpO2 96%   BMI 21.80 kg/m  , BMI Body mass index is 21.8 kg/m. General:Pleasant affect, NAD Skin:Warm and dry, brisk capillary refill HEENT:normocephalic, sclera clear, mucus membranes moist Neck:supple, no JVD, no bruits  Heart:S1S2 RRR with soft systolic murmur, no gallup, rub or click, chest insicion healing Lungs:clear without rales, rhonchi, or wheezes VHQ:IONG, non tender, + BS, do not palpate liver spleen or masses Ext:no lower ext edema, 2+ pedal pulses, 2+ radial  pulses Neuro:alert and oriented x 3, MAE, follows commands, + facial symmetry    EKG:  EKG is NOT ordered today. The ekgs from the hospital were reviewed.      Recent Labs: 03/19/2017: ALT 15 04/06/2017: Magnesium 2.0; TSH 1.518 04/07/2017: BUN 9; Creatinine, Ser 0.64; Hemoglobin 10.0; Platelets 396; Potassium 3.9; Sodium 136    Lipid Panel No results found for: CHOL, TRIG, HDL, CHOLHDL, VLDL, LDLCALC, LDLDIRECT     Other studies Reviewed: Additional studies/ records that were reviewed today include: . Rt and Lt cardiac cath. 12/26/16 Conclusion     There is moderate (3+) aortic regurgitation.   1. Widely patent coronary arteries 2. Bicuspid aortic valve with fluoroscopic evidence of heavy calcification and restricted leaflet mobility, known severe aortic stenosis by noninvasive assessment, and 3+ aortic insufficiency by aortic root angiography 3. Normal  right heart hemodynamics   Indications   Severe aortic stenosis [I35.0 (ICD-10-CM)]   Pre valve replacement dopplers :  - The vertebral arteries appear patent with antegrade flow. - Findings consistent with a 1- 36 percent stenosis involving the   right internal carotid artery and the left internal carotid   artery.   ECHO TEE intra op  Conclusions   Result status: Final result    Left ventricle: Normal cavity size and wall thickness. LV systolic function is normal with an EF of 65-70%. There are no obvious wall motion abnormalities.  Aortic valve: The valve is bicuspid. Moderate valve thickening and moderate calcification present.fusion of the Noncoronary and R coronary cusp, Fusion of the R coronary and L coronary at the annulus. Mean gradient 22-25 mmhg. Moderate stenosis. Aortic root mildly dilated with significant aneurysmal dilation of the ascending aorta 4.3cm. Moderate aortic regurgitation noted.  Aorta: The ascending aorta is moderately dilated. Aneurysm present in the ascending aorta 4.2 - 4.3  cm  Mitral valve: No leaflet thickening and calcification present. Trivial regurgitation.  Tricuspid valve: Mild to moderate regurgitation. The tricuspid valve regurgitation jet is central.   Post Bypass;  Pt currently hemodynamically stable, volume responsive and requiring minimal vasopressor support. Tricuspid, Mitral and Pulmonic valves are unchanged. LV function remains normal with C.O. > 4.0 after chest closure. Descending aorta intact with no dissection present. Bicuspid aortic valve and dilated ascending aorta repaired with 18mm Edwards Magna-Ease pericardial valve and a 19mm Gelweave Valsalva graft and 39mm Hemashield graft. Post repair, AV mean gradient 7 - 9 mmhg with no significant regurgitant flow.      03/21/17  PRE-OPERATIVE DIAGNOSIS:  BISCUSPID AV AS AFIB AORTIC ROOT DILATION  POST-OPERATIVE DIAGNOSIS:  BISCUSPID AV AS  PROCEDURE:  Procedure(s) with comments: BENTALL PROCEDURE (N/A) - CIRC ARREST  LEFT RADIAL A-LINE RESECTION OF ASCENDING ANEURYSM (N/A) TRANSESOPHAGEAL ECHOCARDIOGRAM (TEE) (N/A)   ASSESSMENT AND PLAN:  1.  Bicuspid AV with AS s/p Bentall procedure. And resection of ascending aneurysm.  Is doing well walking eating healthy, minimal incisional pain.  Will check Echo in a few weeks. Follow up with Dr. Johnsie Cancel in 2 months -pt has ABX to take prior to dental procedures  2.  Persistent atrial fib now in SR.  On Eliquis, plan to continue 3-4 weeks if no further a fib then may stop and resume ASA at Dr. Vivi Martens preference of 81 vs 325 mg.  3 weeks is 04/28/18 and she is to see Dr. Cyndia Bent on the 23rd. -         Current medicines are reviewed with the patient today.  The patient Has no concerns regarding medicines.  The following changes have been made:  See above Labs/ tests ordered today include:see above  Disposition:   FU:  see above  Signed, Cecilie Kicks, NP  04/11/2017 2:53 PM    [address],  [place], [place], [place] Phone: [phone]; Fax: [phone]

## 2017-04-16 ENCOUNTER — Other Ambulatory Visit: Payer: Self-pay

## 2017-04-16 ENCOUNTER — Ambulatory Visit (HOSPITAL_COMMUNITY): Payer: BLUE CROSS/BLUE SHIELD | Attending: Cardiovascular Disease

## 2017-04-16 DIAGNOSIS — I4891 Unspecified atrial fibrillation: Secondary | ICD-10-CM | POA: Diagnosis not present

## 2017-04-16 DIAGNOSIS — I359 Nonrheumatic aortic valve disorder, unspecified: Secondary | ICD-10-CM

## 2017-04-16 DIAGNOSIS — I083 Combined rheumatic disorders of mitral, aortic and tricuspid valves: Secondary | ICD-10-CM | POA: Insufficient documentation

## 2017-04-16 DIAGNOSIS — R0600 Dyspnea, unspecified: Secondary | ICD-10-CM | POA: Diagnosis not present

## 2017-04-19 ENCOUNTER — Telehealth (HOSPITAL_COMMUNITY): Payer: Self-pay

## 2017-04-19 NOTE — Telephone Encounter (Signed)
I called and left message on voicemail to call office about scheduling for cardiac rehab. I left office contact information on patient voicemail to return call.  ° °

## 2017-04-24 ENCOUNTER — Ambulatory Visit: Payer: BLUE CROSS/BLUE SHIELD | Admitting: Surgery

## 2017-05-04 ENCOUNTER — Other Ambulatory Visit: Payer: Self-pay | Admitting: Physician Assistant

## 2017-05-07 ENCOUNTER — Other Ambulatory Visit: Payer: Self-pay | Admitting: Surgery

## 2017-05-07 DIAGNOSIS — I350 Nonrheumatic aortic (valve) stenosis: Secondary | ICD-10-CM

## 2017-05-08 ENCOUNTER — Ambulatory Visit
Admission: RE | Admit: 2017-05-08 | Discharge: 2017-05-08 | Disposition: A | Discharge status: Home / Self Care | Payer: BLUE CROSS/BLUE SHIELD | Source: Ambulatory Visit | Attending: Surgery | Admitting: Surgery

## 2017-05-08 ENCOUNTER — Encounter: Payer: Self-pay | Admitting: Surgery

## 2017-05-08 ENCOUNTER — Ambulatory Visit (INDEPENDENT_AMBULATORY_CARE_PROVIDER_SITE_OTHER): Payer: Self-pay | Admitting: Surgery

## 2017-05-08 VITALS — BP 123/73 | HR 67 | Resp 16 | Ht 64.0 in | Wt 127.0 lb

## 2017-05-08 DIAGNOSIS — Q230 Congenital stenosis of aortic valve: Secondary | ICD-10-CM

## 2017-05-08 DIAGNOSIS — M4804 Spinal stenosis, thoracic region: Secondary | ICD-10-CM | POA: Diagnosis not present

## 2017-05-08 DIAGNOSIS — I350 Nonrheumatic aortic (valve) stenosis: Secondary | ICD-10-CM

## 2017-05-08 DIAGNOSIS — Z09 Encounter for follow-up examination after completed treatment for conditions other than malignant neoplasm: Secondary | ICD-10-CM

## 2017-05-08 DIAGNOSIS — Q231 Congenital insufficiency of aortic valve: Secondary | ICD-10-CM

## 2017-05-08 DIAGNOSIS — I77810 Thoracic aortic ectasia: Secondary | ICD-10-CM

## 2017-05-08 NOTE — Progress Notes (Signed)
HPI: Patient returns for routine postoperative follow-up having undergone Bentall procedure using a pericardial bioprosthesis and resection of an ascending aortic aneurysm on 03/22/2017. The patient's early postoperative recovery while in the hospital was notable for mediastinal bleeding postop requiring a return to the OR several hours postop for ligation of a bleeding thymic blood vessel. She made an uneventful recovery after that and was discharged but developed atrial fibrillation with RVR and was readmitted 10 days later by cardiology. She converted on Cardizem and was discharged home on Eliquis for three weeks.  Since hospital discharge the patient reports that she has maintained a regular rhythm. She stopped the Eliquis after three weeks and is now on ECASA 325 mg daily. She is walking several miles per day without chest pain or shortness of breath. She was recently seen by cardiology and had a repeat echo which showed a normally functioning aortic valve prosthesis with a low mean gradient of 10 mm Hg. Her LVEF is normal.   Current Outpatient Prescriptions  Medication Sig Dispense Refill  . acetaminophen (TYLENOL) 500 MG tablet Take 2 tablets (1,000 mg total) by mouth every 6 (six) hours. 30 tablet 0  . aspirin EC 325 MG tablet Take 325 mg by mouth daily.    . metoprolol tartrate (LOPRESSOR) 25 MG tablet Take 1 tablet (25 mg total) by mouth as needed (palpitations). 30 tablet 3  . Multiple Vitamin (MULTIVITAMIN WITH MINERALS) TABS tablet Take 1 tablet by mouth daily.    . Scar Treatment Products (MEDERMA) GEL Apply 1 application topically daily.    . sertraline (ZOLOFT) 100 MG tablet Take 100 mg by mouth daily.     No current facility-administered medications for this visit.     Physical Exam: BP 123/73 (BP Location: Right Arm, Patient Position: Sitting, Cuff Size: Normal)   Pulse 67   Resp 16   Ht 5\' 4"  (1.626 m)   Wt 127 lb (57.6 kg)   SpO2 97% Comment: ON RA  BMI 21.80 kg/m   She looks well. Lung exam is clear. Cardiac exam shows a regular rate and rhythm with normal heart sounds. There is a 2/6 systolic flow murmur over the aortic valve Chest incision is healing well and sternum is stable. There is no peripheral edema.   Diagnostic Tests:  Result status: Final result                           [facility]                        [address], [place] [address]                            [phone]  ------------------------------------------------------------------- Transthoracic Echocardiography  Patient:    Jessica [name], Jessica [name] MR #:       [id number] Study Date: 04/16/2017 Gender:     F Age:        56 Height:     162.6 cm Weight:     57.6 kg BSA:        1.62 m^2 Pt. Status: Room:   ATTENDING    Kamylle, Axelson R  REFERRING  Cecilie Kicks R  SONOGRAPHER  Marygrace Drought, RCS  PERFORMING   [facility], Outpatient  cc:  ------------------------------------------------------------------- LV EF: 60% -   65%  ------------------------------------------------------------------- Indications:      Aortic Valve Disorder (I35.9).  ------------------------------------------------------------------- History:   PMH:   Dyspnea.  Atrial fibrillation.  ------------------------------------------------------------------- Study Conclusions  - Left ventricle: The cavity size was normal. Systolic function was   normal. The estimated ejection fraction was in the range of 60%   to 65%. Wall motion was normal; there were no regional wall   motion abnormalities. Left ventricular diastolic function   parameters were normal. - Aortic valve: A bioprosthesis was present and functioning   normally. Transvalvular velocity was within the normal range.   There was no stenosis. There was trivial perivalvular   regurgitation. Mean gradient (S): 10 mm Hg. Valve area (VTI):   1.57 cm^2. Valve  area (Vmax): 1.39 cm^2. Valve area (Vmean): 1.46   cm^2. - Mitral valve: Transvalvular velocity was within the normal range.   There was no evidence for stenosis. There was trivial   regurgitation. - Left atrium: The atrium was mildly dilated. - Right ventricle: The cavity size was normal. Wall thickness was   normal. Systolic function was normal. - Atrial septum: No defect or patent foramen ovale was identified. - Tricuspid valve: There was mild-moderate regurgitation. - Pulmonary arteries: Systolic pressure was within the normal   range. PA peak pressure: 21 mm Hg (S). - Pericardium, extracardiac: A trivial pericardial effusion was   identified.  ------------------------------------------------------------------- Study data:  Comparison was made to the study of 12/14/2016.  Study status:  Routine.  Procedure:  The patient reported no pain pre or post test. Transthoracic echocardiography. Image quality was adequate.          Transthoracic echocardiography.  M-mode, complete 2D, spectral Doppler, and color Doppler.  Birthdate: Patient birthdate: 1961/06/04.  Age:  Patient is 56 yr old.  Sex: Gender: female.    BMI: 21.8 kg/m^2.  Blood pressure:     126/80 Patient status:  Outpatient.  Study date:  Study date: 04/16/2017. Study time: 01:16 PM.  Location:  [facility]  -------------------------------------------------------------------  ------------------------------------------------------------------- Left ventricle:  The cavity size was normal. Systolic function was normal. The estimated ejection fraction was in the range of 60% to 65%. Wall motion was normal; there were no regional wall motion abnormalities. The transmitral flow pattern was normal. The deceleration time of the early transmitral flow velocity was normal. The pulmonary vein flow pattern was normal. The tissue Doppler parameters were normal. Left ventricular diastolic function parameters were  normal.  ------------------------------------------------------------------- Aortic valve:  A bioprosthesis was present and functioning normally. Mobility was not restricted.  Doppler:  Transvalvular velocity was within the normal range. There was no stenosis. There was trivial perivalvular regurgitation.    VTI ratio of LVOT to aortic valve: 0.5. Valve area (VTI): 1.57 cm^2. Indexed valve area (VTI): 0.97 cm^2/m^2. Peak velocity ratio of LVOT to aortic valve: 0.44. Valve area (Vmax): 1.39 cm^2. Indexed valve area (Vmax): 0.86 cm^2/m^2. Mean velocity ratio of LVOT to aortic valve: 0.47. Valve area (Vmean): 1.46 cm^2. Indexed valve area (Vmean): 0.91 cm^2/m^2.    Mean gradient (S): 10 mm Hg. Peak gradient (S): 21 mm Hg.  ------------------------------------------------------------------- Aorta:  Aortic root: The aortic root was normal in size.  ------------------------------------------------------------------- Mitral valve:   Structurally normal valve.   Mobility was not restricted.  Doppler:  Transvalvular velocity was within the normal range. There was no  evidence for stenosis. There was trivial regurgitation.    Valve area by pressure half-time: 3.49 cm^2. Indexed valve area by pressure half-time: 2.16 cm^2/m^2.    Peak gradient (D): 3 mm Hg.  ------------------------------------------------------------------- Left atrium:  The atrium was mildly dilated.  ------------------------------------------------------------------- Atrial septum:  No defect or patent foramen ovale was identified.   ------------------------------------------------------------------- Right ventricle:  The cavity size was normal. Wall thickness was normal. Systolic function was normal.  ------------------------------------------------------------------- Pulmonic valve:    Doppler:  Transvalvular velocity was within the normal range. There was no evidence for stenosis. There was  mild regurgitation.  ------------------------------------------------------------------- Tricuspid valve:   Structurally normal valve.    Doppler: Transvalvular velocity was within the normal range. There was mild-moderate regurgitation.  ------------------------------------------------------------------- Pulmonary artery:   The main pulmonary artery was normal-sized. Systolic pressure was within the normal range.  ------------------------------------------------------------------- Right atrium:  The atrium was normal in size.  ------------------------------------------------------------------- Pericardium:  A trivial pericardial effusion was identified.  ------------------------------------------------------------------- Systemic veins: Inferior vena cava: The vessel was normal in size. The respirophasic diameter changes were in the normal range (= 50%), consistent with normal central venous pressure. Diameter: 17 mm.  ------------------------------------------------------------------- Measurements   IVC                                       Value          Reference  ID                                        17    mm       ---------    Left ventricle                            Value          Reference  LV ID, ED, PLAX chordal                   48    mm       43 - 52  LV ID, ES, PLAX chordal                   30    mm       23 - 38  LV fx shortening, PLAX chordal            38    %        >=29  LV PW thickness, ED                       10.33 mm       ---------  IVS/LV PW ratio, ED                       0.97           <=1.3  Stroke volume, 2D                         69    ml       ---------  Stroke volume/bsa, 2D                     43  ml/m^2   ---------  LV e&', lateral                            10.9  cm/s     ---------  LV E/e&', lateral                          8.5            ---------  LV e&', medial                             6.2   cm/s     ---------  LV  E/e&', medial                           14.94          ---------  LV e&', average                            8.55  cm/s     ---------  LV E/e&', average                          10.83          ---------    Ventricular septum                        Value          Reference  IVS thickness, ED                         10    mm       ---------    LVOT                                      Value          Reference  LVOT ID, S                                20    mm       ---------  LVOT area                                 3.14  cm^2     ---------  LVOT peak velocity, S                     102   cm/s     ---------  LVOT mean velocity, S                     67.6  cm/s     ---------  LVOT VTI, S                               22    cm       ---------    Aortic valve  Value          Reference  Aortic valve peak velocity, S             230   cm/s     ---------  Aortic valve mean velocity, S             145   cm/s     ---------  Aortic valve VTI, S                       44.1  cm       ---------  Aortic mean gradient, S                   10    mm Hg    ---------  Aortic peak gradient, S                   21    mm Hg    ---------  VTI ratio, LVOT/AV                        0.5            ---------  Aortic valve area, VTI                    1.57  cm^2     ---------  Aortic valve area/bsa, VTI                0.97  cm^2/m^2 ---------  Velocity ratio, peak, LVOT/AV             0.44           ---------  Aortic valve area, peak velocity          1.39  cm^2     ---------  Aortic valve area/bsa, peak               0.86  cm^2/m^2 ---------  velocity  Velocity ratio, mean, LVOT/AV             0.47           ---------  Aortic valve area, mean velocity          1.46  cm^2     ---------  Aortic valve area/bsa, mean               0.91  cm^2/m^2 ---------  velocity    Aorta                                     Value          Reference  Aortic root ID, ED                        38    mm        ---------    Left atrium                               Value          Reference  LA ID, A-P, ES                            42    mm       ---------  LA ID/bsa, A-P                    (H)     2.6   cm/m^2   <=2.2  LA volume, S                              52.8  ml       ---------  LA volume/bsa, S                          32.7  ml/m^2   ---------  LA volume, ES, 1-p A4C                    45.3  ml       ---------  LA volume/bsa, ES, 1-p A4C                28    ml/m^2   ---------  LA volume, ES, 1-p A2C                    55.6  ml       ---------  LA volume/bsa, ES, 1-p A2C                34.4  ml/m^2   ---------    Mitral valve                              Value          Reference  Mitral E-wave peak velocity               92.6  cm/s     ---------  Mitral A-wave peak velocity               58.7  cm/s     ---------  Mitral deceleration time                  215   ms       150 - 230  Mitral pressure half-time                 63    ms       ---------  Mitral peak gradient, D                   3     mm Hg    ---------  Mitral E/A ratio, peak                    1.6            ---------  Mitral valve area, PHT, DP                3.49  cm^2     ---------  Mitral valve area/bsa, PHT, DP            2.16  cm^2/m^2 ---------    Pulmonary arteries                        Value          Reference  PA pressure, S, DP                        21    mm Hg    <=  30    Tricuspid valve                           Value          Reference  Tricuspid regurg peak velocity            212   cm/s     ---------  Tricuspid peak RV-RA gradient             18    mm Hg    ---------  Tricuspid maximal regurg                  212   cm/s     ---------  velocity, PISA    Systemic veins                            Value          Reference  Estimated CVP                             3     mm Hg    ---------    Right ventricle                           Value          Reference  TAPSE                                      16.9  mm       ---------  RV pressure, S, DP                        21    mm Hg    <=30  RV s&', lateral, S                         11.1  cm/s     ---------  Legend: (L)  and  (H)  mark values outside specified reference range.  ------------------------------------------------------------------- Prepared and Electronically Authenticated by  Skeet Latch, MD 2018-08-14T18:09:23    CLINICAL DATA:  History of then talk procedure on March 21, 2017. Follow-up study. No current chest complaints.  EXAM: CHEST  2 VIEW  COMPARISON:  Chest x-ray of April 06, 2017  FINDINGS: The lungs are well-expanded and clear. There is no pleural effusion or pneumothorax. The heart and pulmonary vascularity are normal. The aortic valve ring is in stable position. The sternal wires are intact. There is mild multilevel degenerative disc space narrowing of the thoracic spine.  IMPRESSION: There is no acute cardiopulmonary abnormality.  No CHF.   Electronically Signed   By: David  Martinique M.D.   On: 05/08/2017 14:03   Impression:   Overall I think she is doing well. I encouraged her to continue walking. She is not planning to participate in cardiac rehab and wants to exercise with her friend.  I told her that she could drive her car but should not lift anything heavier than 10 lbs for three months postop. She appears to be maintaining sinus rhythm.     Plan:  She will continue to follow-up with Dr. Shelia Media and Dr. Johnsie Cancel and will contact me  if she develops any problems with her incision.   Gaye Pollack, MD [facility] [phone]

## 2017-05-14 ENCOUNTER — Telehealth (HOSPITAL_COMMUNITY): Payer: Self-pay

## 2017-05-14 NOTE — Telephone Encounter (Signed)
I called patient to discuss scheduling for cardiac rehab. Patient declined at this time. Patient stated that she is currently exercising and stated that she has spoken with Dr. Cyndia Bent and have decided that cardiac rehab is not necessary at this time. Referral closed.

## 2017-06-23 ENCOUNTER — Telehealth: Payer: BLUE CROSS/BLUE SHIELD | Admitting: Physician Assistant

## 2017-06-23 DIAGNOSIS — R300 Dysuria: Secondary | ICD-10-CM

## 2017-06-23 MED ORDER — NITROFURANTOIN MONOHYD MACRO 100 MG PO CAPS: 100.0000 mg | ORAL_CAPSULE | Freq: Two times a day (BID) | ORAL | 0 refills | Status: DC

## 2017-06-23 NOTE — Progress Notes (Signed)

## 2017-06-27 IMAGING — CT CT HEART MORP W/ CTA COR W/ SCORE W/ CA W/CM &/OR W/O CM
2 of 3 series · 15 of 20 positions shown, 17 images · IV contrast (OMNI 350)
Comparison: None.

CLINICAL DATA: Aortic stenosis

EXAM:
Cardiac CT
TECHNIQUE: The patient was scanned on a Siemens [REDACTED]ice scanner. A 120 kV
retrospective scan was triggered in the ascending thoracic aorta at
120 HU's. Gantry rotation speed was 300 msecs and collimation was .6
mm. No beta blockade or nitro were given. The 3D data set was
reconstructed in 10 % intervals of the R-R cycle. Systolic and
diastolic phases were analyzed on a dedicated work station using
MPR, MIP and VRT modes. The patient received 80 cc of contrast.

[Series 5: corcta 0.6 b30f bestsyst 33 % · axial · 0.39mm/px · z∈[+1117,+1280]mm · 7 of 724 slices shown]
[im 91/724  vessel]
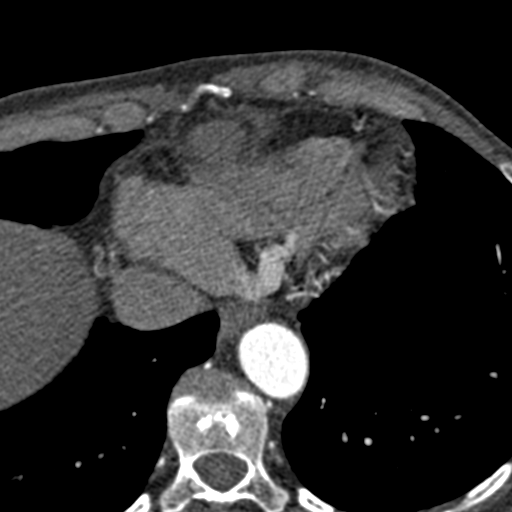
[im 181/724  vessel]
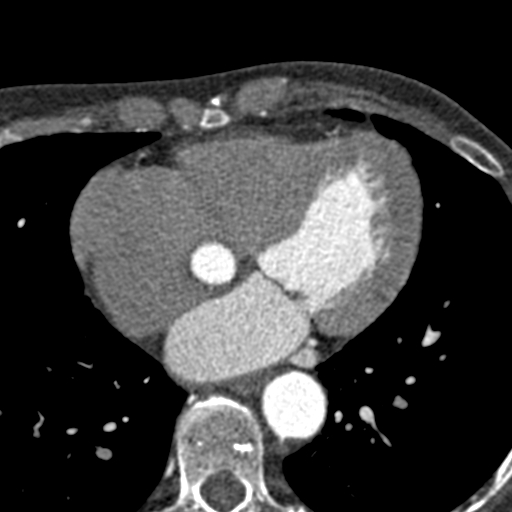
[im 272/724  vessel]
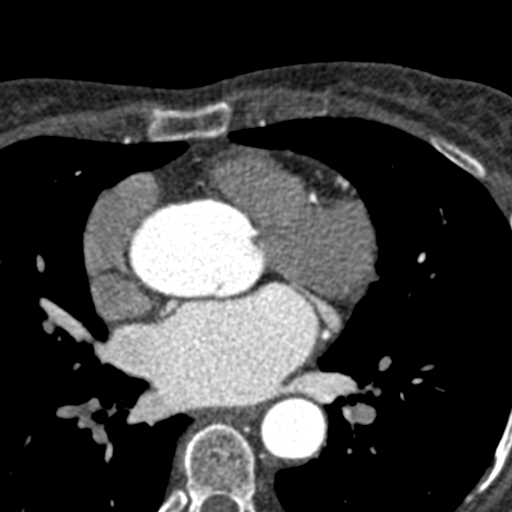
[im 362/724  vessel]
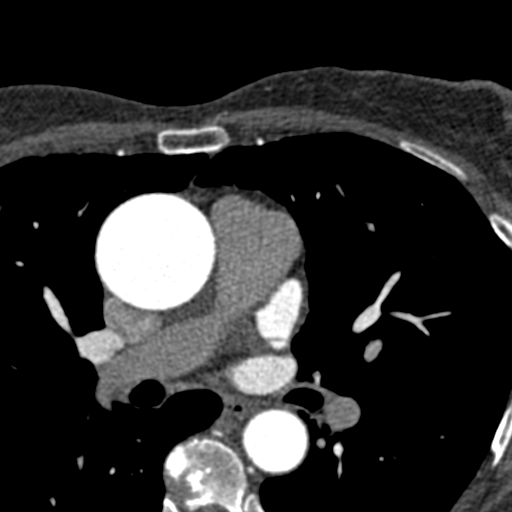
[im 452/724  vessel]
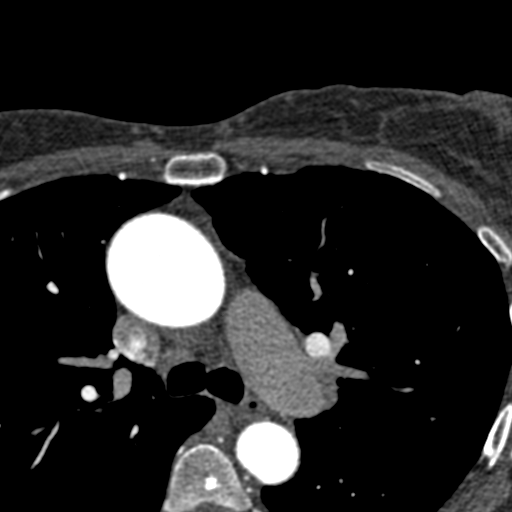
[im 543/724  vessel]
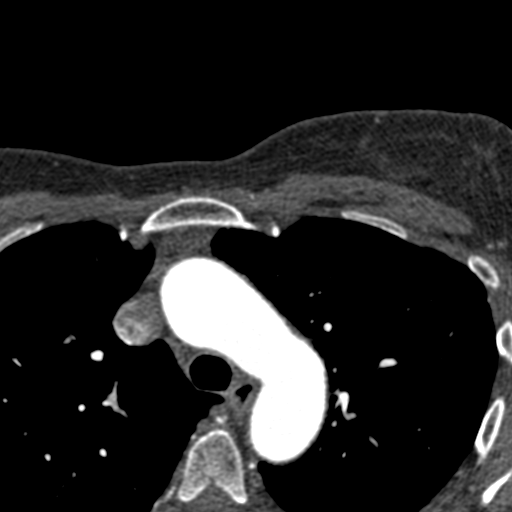
[im 633/724  vessel]
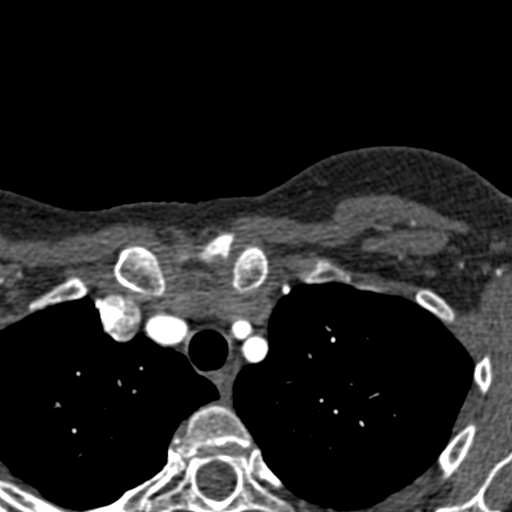

[Series 6: corcta 0.6 b26f bestdiast 70 % · axial · 0.39mm/px · z∈[+1114,+1283]mm · 8 of 724 slices shown, 10 images]
[im 81/724  vessel]
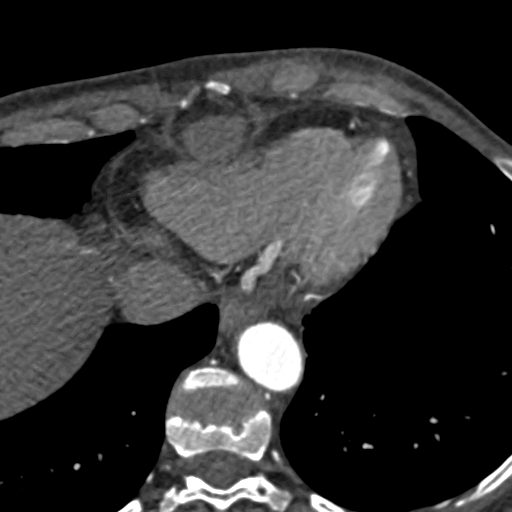
[im 81/724  lung]
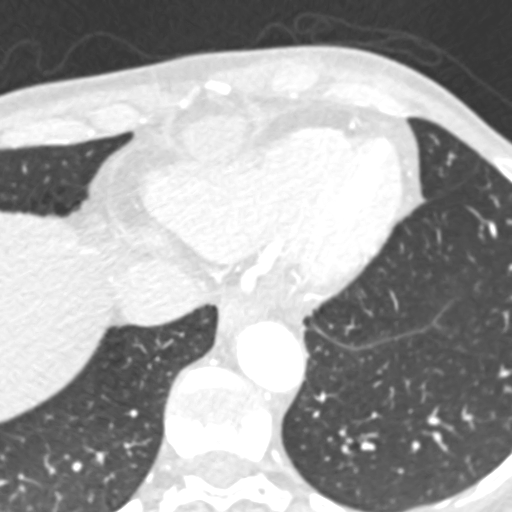
[im 161/724  vessel]
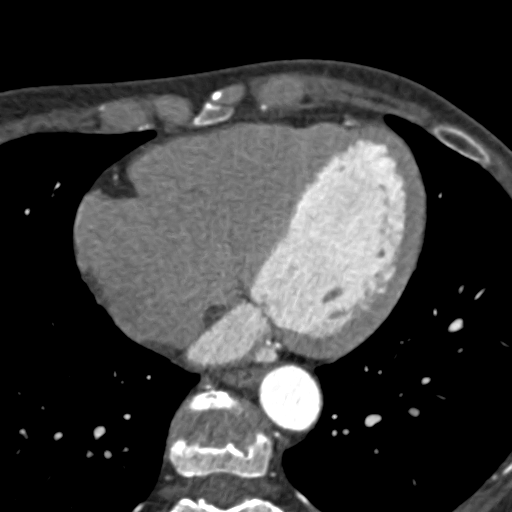
[im 242/724  vessel]
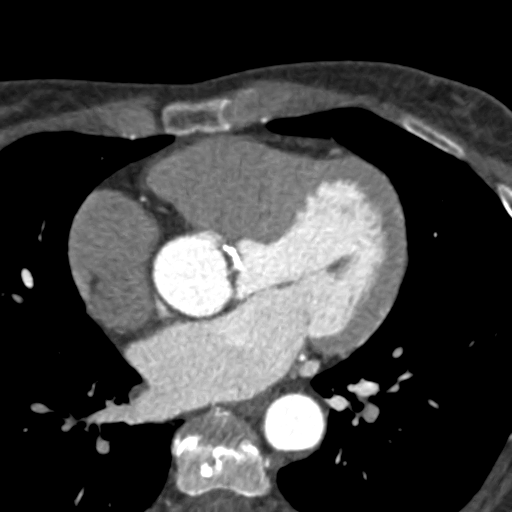
[im 322/724  vessel]
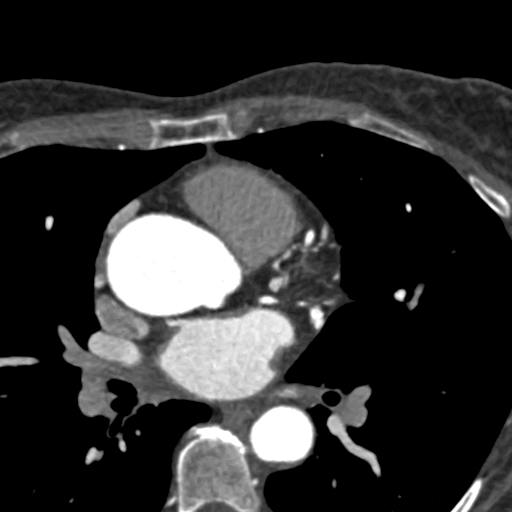
[im 402/724  vessel]
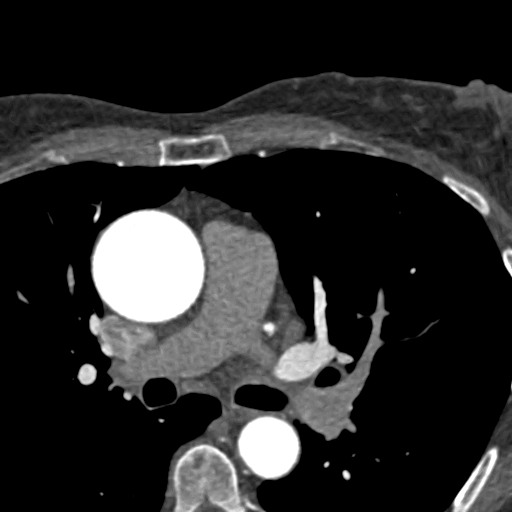
[im 402/724  lung]
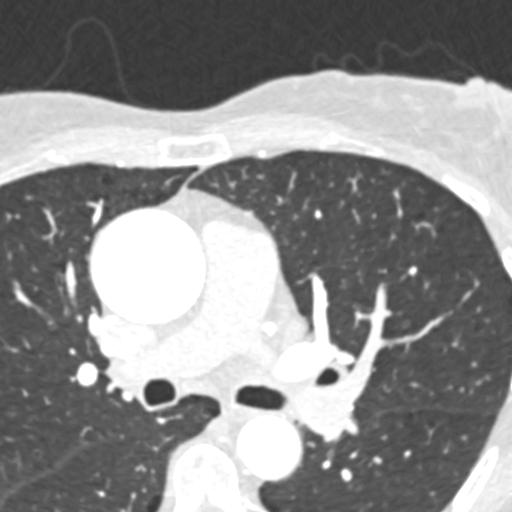
[im 483/724  vessel]
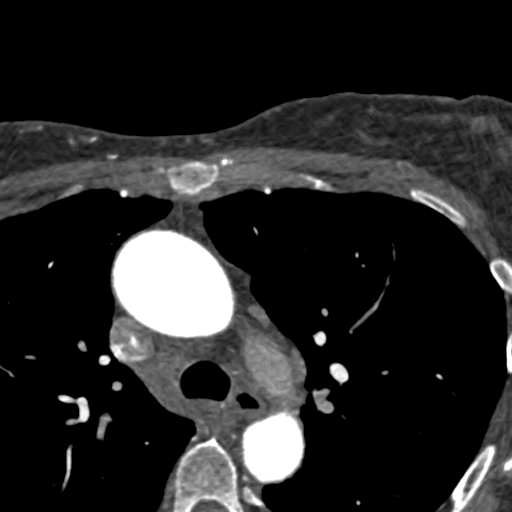
[im 563/724  vessel]
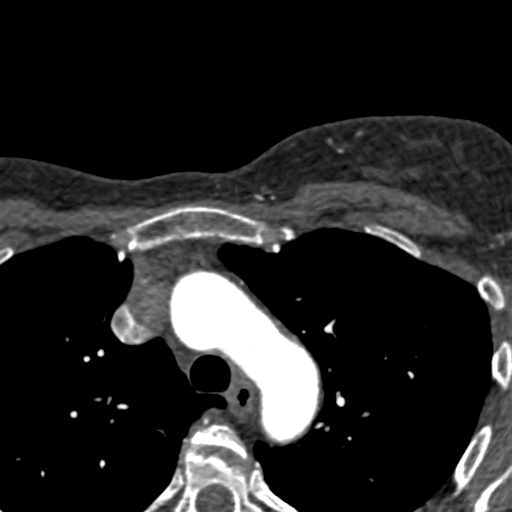
[im 643/724  vessel]
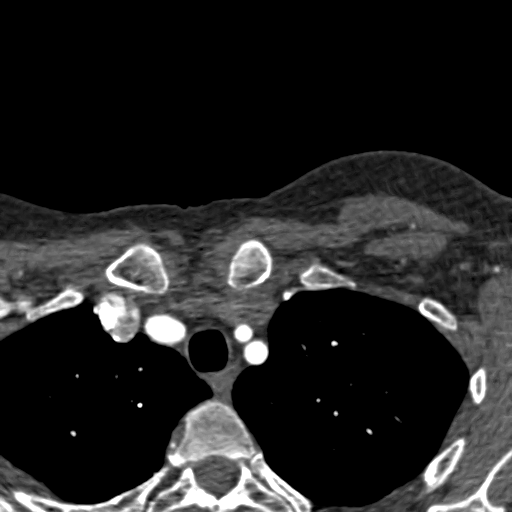

[15 of 20 positions shown; findings below may reference images not displayed]

FINDINGS: Aortic Valve: Bicuspid with fused right and left cusps. Dalia type
one Heavily calcified with restricted motion

Aorta: Moderate aortic root dilatation 4.6 cm Normal great vessel
origins. Mild atherosclerosis and calcification of the arch No
coarctation

Sinotubular Junction:  3.6 cm

Ascending Thoracic Aorta:  4.6 cm

Aortic Arch:  3.5 cm

Descending Thoracic Aorta:  2.3 cm

Sinus:  4.2 cm

Coronary Artery Height above Annulus:

Left Main:  15 mm

Right Coronary:  17 mm

Virtual Basal Annulus Measurements:

Maximum/Minimum Diameter:  26.4 x 21.7 mm

Area:  478 mm2

Coronary Arteries: Left dominant with some calcification of the mid
LAD RCA is diminutive
IMPRESSION: 1) Dalia type 1 bicuspid aortic valve with fused right and left
cusp

2) Left dominant coronary arteries with calcification of the mid LAD

3) Moderate aortic root dilatation 4.6 cm

Amenuvor Liggi

EXAM:
OVER-READ INTERPRETATION  CT CHEST

The following report is an over-read performed by radiologist Dr.
over-read does not include interpretation of cardiac or coronary
anatomy or pathology. The coronary calcium score/coronary CTA
interpretation by the cardiologist is attached.
FINDINGS: Aortic atherosclerosis. Mild aneurysmal dilatation of the ascending
thoracic aorta (4.6 cm in diameter). Mild diffuse bronchial wall
thickening with mild centrilobular and paraseptal emphysema. Several
tiny 2-4 mm calcified and noncalcified pulmonary nodules are noted
throughout the lungs bilaterally. No larger more suspicious
appearing pulmonary nodules or masses are noted in the visualized
portions of the lungs. Within the visualized thorax there is no
acute consolidative airspace disease, pleural effusions,
pneumothorax or lymphadenopathy. Visualized portions of the upper
abdomen are unremarkable. There are no aggressive appearing lytic or
blastic lesions noted in the visualized portions of the skeleton.
IMPRESSION: 1. Aortic atherosclerosis with ascending thoracic aortic aneurysm
measuring 4.6 cm in diameter. Ascending thoracic aortic aneurysm.
Recommend semi-annual imaging followup by CTA or MRA and referral to
cardiothoracic surgery if not already obtained. This recommendation
follows 7323 ACCF/AHA/AATS/ACR/ASA/SCA/LIZA/MARIA FELIX/GIORGI/JIM Guidelines
for the Diagnosis and Management of Patients With Thoracic Aortic
Disease. Circulation. 7323; 121: [id number].
2. Mild diffuse bronchial wall thickening with mild centrilobular
and paraseptal emphysema; imaging findings suggestive of underlying
COPD.
3. Several tiny 2-4 mm calcified and noncalcified pulmonary nodules
scattered throughout the lungs bilaterally. The noncalcified
pulmonary nodules are nonspecific, but statistically likely benign.
No follow-up needed if patient is low-risk (and has no known or
suspected primary neoplasm). Non-contrast chest CT can be considered
in 12 months if patient is high-risk. This recommendation follows
the consensus statement: Guidelines for Management of Incidental
Pulmonary Nodules Detected on CT Images: From the [HOSPITAL]

## 2017-07-04 DIAGNOSIS — J029 Acute pharyngitis, unspecified: Secondary | ICD-10-CM | POA: Diagnosis not present

## 2017-07-04 DIAGNOSIS — J019 Acute sinusitis, unspecified: Secondary | ICD-10-CM | POA: Diagnosis not present

## 2017-07-10 NOTE — Progress Notes (Signed)
Cardiology Office Note   Date:  07/12/2017   ID:  Jessica [name], DOB 08-20-1961, MRN [id number]  PCP:  Deland Pretty, MD  Cardiologist:  Dr. Johnsie Cancel    No chief complaint on file.     History of Present Illness: Jessica [name] is a 56 y.o. female f/u valvular heart disease   Bentall procedure (aortic valve and root replacement) on 03/21/17, discharge 03/26/17, and PAF with CHA2DS2 VASc score of 1 who presented to the [facility] ED on 04/06/17 with complaints of palpitations and found to be in atrial fibrillation with RVR.   Hstory of bicuspid aortic valve disease Bentall procedure using a 23 mm Edwards magna ease pericardial valve and a 26 mm Gelweave Valsalva graft on 03/21/2017 with Dr. Cyndia Bent). She did not require CABG as preoperative left heart catheterization showed normal coronary arteries. Left ventricularEF was also normal at 60-65%. Perdischarge summary, her postop recovery was fairly unremarkable. No issues with atrial arrhythmias were noted indischarge summary, However patient reports that she does have a history of paroxysmal atrial fibrillation and noted that she was in A. fib the day she presented with her surgery but she had no issues postoperatively. Her CHA2DS2 VASc score score is only 1 for female sex, Gibson Ramp has not been on oral anticoagulation.She was discharged home on 03/26/2017.   She was started on Eliquis 5 mg twice daily. Plan is for her to take Eliquis for 3-4 weeks. We will also Rx PRN Lopressor for palpitations. ASA was stopped at discharge. She will need to go back on ASA when off Eliquis.    Post op echo reviewed 04/16/17 EF 60-65% mean gradient 10 mmHg trivial perivalvular regurgitation  Doing well needs some orhtodontic work on gums told her to wait till January , 6 months from surgery  And remember SBE prophylaxis   Doing some Yoga and thinking of spin class with old friend Amy Doming[place] who's parents I took care of   Past Medical History:  Diagnosis  Date  . Anxiety   . Aortic stenosis   . Bicuspid aortic valve    Previously seen in [place]; moderate AS and mild AR  . Chronic neck pain   . Congenital heart valve abnormality   . Dyspnea    with exertion  . Heart murmur   . Paroxysmal atrial fibrillation (HCC)   . PONV (postoperative nausea and vomiting)   . Vertigo     Past Surgical History:  Procedure Laterality Date  . abscess excision     Right thigh  . ENDOMETRIAL ABLATION    . HERNIA REPAIR    . KNEE SURGERY     Multiple  . LAPAROSCOPY     x2  . TUBAL LIGATION       Current Outpatient Medications  Medication Sig Dispense Refill  . aspirin EC 325 MG tablet Take 325 mg by mouth daily.    . metoprolol tartrate (LOPRESSOR) 25 MG tablet Take 1 tablet (25 mg total) by mouth as needed (palpitations). 30 tablet 3  . Multiple Vitamin (MULTIVITAMIN WITH MINERALS) TABS tablet Take 1 tablet by mouth daily.    . Scar Treatment Products (MEDERMA) GEL Apply 1 application topically daily.    . sertraline (ZOLOFT) 100 MG tablet Take 100 mg by mouth daily.     No current facility-administered medications for this visit.     Allergies:   Penicillins    Social History:  The patient  reports that she quit smoking about 7 years ago.  she has never used smokeless tobacco. She reports that she drinks alcohol. She reports that she does not use drugs.   Family History:  The patient's family history includes Breast cancer in her paternal grandmother; Heart attack in her father and mother; Hypertension in her father and mother.    ROS:  General:no colds or fevers, + weight changesby 3 lbs Skin:no rashes or ulcers HEENT:no blurred vision, no congestion CV:see HPI PUL:see HPI GI:no diarrhea constipation or melena, no indigestion GU:no hematuria, no dysuria MS:no joint pain, no claudication Neuro:no syncope, no lightheadedness Endo:no diabetes, no thyroid disease  Wt Readings from Last 3 Encounters:  07/12/17 134 lb 4 oz (60.9 kg)    05/08/17 127 lb (57.6 kg)  04/11/17 127 lb (57.6 kg)     PHYSICAL EXAM: VS:  BP 122/72   Pulse 63   Ht 5\' 4"  (1.626 m)   Wt 134 lb 4 oz (60.9 kg)   SpO2 97%   BMI 23.04 kg/m  , BMI Body mass index is 23.04 kg/m. Affect appropriate Healthy:  appears stated age 72: normal Neck supple with no adenopathy JVP normal left  bruits no thyromegaly Lungs clear with no wheezing and good diaphragmatic motion Heart:  S1/S2 SEM through AVR no AR murmur, no rub, gallop or click PMI normal post sternotomy  Abdomen: benighn, BS positve, no tenderness, no AAA no bruit.  No HSM or HJR Distal pulses intact with no bruits No edema Neuro non-focal Skin warm and dry No muscular weakness    EKG:   SR rate 71 nonspecific ST changes 04/06/17    Recent Labs: 03/19/2017: ALT 15 04/06/2017: Magnesium 2.0; TSH 1.518 04/07/2017: BUN 9; Creatinine, Ser 0.64; Hemoglobin 10.0; Platelets 396; Potassium 3.9; Sodium 136    Lipid Panel No results found for: CHOL, TRIG, HDL, CHOLHDL, VLDL, LDLCALC, LDLDIRECT     Other studies Reviewed: Additional studies/ records that were reviewed today include: . Rt and Lt cardiac cath. 12/26/16 Conclusion     There is moderate (3+) aortic regurgitation.   1. Widely patent coronary arteries 2. Bicuspid aortic valve with fluoroscopic evidence of heavy calcification and restricted leaflet mobility, known severe aortic stenosis by noninvasive assessment, and 3+ aortic insufficiency by aortic root angiography 3. Normal right heart hemodynamics   Indications   Severe aortic stenosis [I35.0 (ICD-10-CM)]   Pre valve replacement dopplers :  - The vertebral arteries appear patent with antegrade flow. - Findings consistent with a 1- 62 percent stenosis involving the   right internal carotid artery and the left internal carotid   artery.   ECHO TEE intra op  Conclusions   Result status: Final result    Left ventricle: Normal cavity size and wall  thickness. LV systolic function is normal with an EF of 65-70%. There are no obvious wall motion abnormalities.  Aortic valve: The valve is bicuspid. Moderate valve thickening and moderate calcification present.fusion of the Noncoronary and R coronary cusp, Fusion of the R coronary and L coronary at the annulus. Mean gradient 22-25 mmhg. Moderate stenosis. Aortic root mildly dilated with significant aneurysmal dilation of the ascending aorta 4.3cm. Moderate aortic regurgitation noted.  Aorta: The ascending aorta is moderately dilated. Aneurysm present in the ascending aorta 4.2 - 4.3 cm  Mitral valve: No leaflet thickening and calcification present. Trivial regurgitation.  Tricuspid valve: Mild to moderate regurgitation. The tricuspid valve regurgitation jet is central.   Post Bypass;  Pt currently hemodynamically stable, volume responsive and requiring minimal vasopressor support. Tricuspid, Mitral  and Pulmonic valves are unchanged. LV function remains normal with C.O. > 4.0 after chest closure. Descending aorta intact with no dissection present. Bicuspid aortic valve and dilated ascending aorta repaired with 48mm Edwards Magna-Ease pericardial valve and a 45mm Gelweave Valsalva graft and 106mm Hemashield graft. Post repair, AV mean gradient 7 - 9 mmhg with no significant regurgitant flow.      03/21/17  PRE-OPERATIVE DIAGNOSIS:  BISCUSPID AV AS AFIB AORTIC ROOT DILATION  POST-OPERATIVE DIAGNOSIS:  BISCUSPID AV AS  PROCEDURE:  Procedure(s) with comments: BENTALL PROCEDURE (N/A) - CIRC ARREST  LEFT RADIAL A-LINE RESECTION OF ASCENDING ANEURYSM (N/A) TRANSESOPHAGEAL ECHOCARDIOGRAM (TEE) (N/A)   ASSESSMENT AND PLAN:  1.  Bicuspid AV with AS s/p Bentall procedure. And resection of ascending aneurysm.    -pt has ABX to take prior to dental procedures post op echo 04/16/17 mean gradient 10 mmHg Trivial perivalvular regurgitation EF 60-65%  2.  Persistent atrial fib now in SR.    On ASA eliquis stopped per Dr Cyndia Bent   3. Dental:  Wait until January for any gum work will be 6 months out from AVR        Current medicines are reviewed with the patient today.  The patient Has no concerns regarding medicines.  The following changes have been made:  See above Labs/ tests ordered today include:see above  Disposition:   FU:  see above  Signed, Jenkins Rouge, MD  07/12/2017 12:00 PM    [facility], [place], [place], [place] Phone: [phone]; Fax: [phone]

## 2017-07-12 ENCOUNTER — Ambulatory Visit (INDEPENDENT_AMBULATORY_CARE_PROVIDER_SITE_OTHER): Payer: BLUE CROSS/BLUE SHIELD | Admitting: Cardiovascular Disease

## 2017-07-12 ENCOUNTER — Encounter: Payer: Self-pay | Admitting: Cardiovascular Disease

## 2017-07-12 VITALS — BP 122/72 | HR 63 | Ht 64.0 in | Wt 134.2 lb

## 2017-07-12 DIAGNOSIS — I359 Nonrheumatic aortic valve disorder, unspecified: Secondary | ICD-10-CM

## 2017-07-12 DIAGNOSIS — I4819 Other persistent atrial fibrillation: Secondary | ICD-10-CM

## 2017-07-12 DIAGNOSIS — I481 Persistent atrial fibrillation: Secondary | ICD-10-CM

## 2017-07-12 NOTE — Patient Instructions (Addendum)

## 2017-07-23 ENCOUNTER — Telehealth: Payer: Self-pay | Admitting: Cardiovascular Disease

## 2017-07-23 NOTE — Telephone Encounter (Signed)
Pt.notified

## 2017-07-23 NOTE — Telephone Encounter (Signed)
I spoke with pt. She is considering umbilical hernia repair. Has had hernia for awhile and it is not severe. Only sore when doing activities such as sit ups. She has not seen surgeon yet.  Is asking if Dr. Johnsie Cancel thinks she should consider surgery this year or how long should she wait before having surgery.

## 2017-07-23 NOTE — Telephone Encounter (Signed)
New message   Patient calling regarding hernia. States she wants to know if she should consider hernia surgery this year.  Undecided wether or not to speak with a surgeon because of heart surgery in July.   Patient has not spoken with a surgeon,wants to know if it advisable to do another surgery this year. Please call.

## 2017-07-23 NOTE — Telephone Encounter (Signed)
Early next year or after 6 months should be fine

## 2017-08-01 ENCOUNTER — Telehealth: Payer: Self-pay | Admitting: Cardiovascular Disease

## 2017-08-01 NOTE — Telephone Encounter (Signed)
Walk in pt Form-[employer] Statement paperwork dropped off. Placed in [facility] box.

## 2017-08-15 ENCOUNTER — Telehealth: Payer: Self-pay | Admitting: Cardiovascular Disease

## 2017-08-15 NOTE — Telephone Encounter (Signed)
Called left pt vm per Nurse patient should complete her part first on the [place] form 1st and then Dr. Johnsie Cancel can complete his part.

## 2017-10-02 IMAGING — CR DG CHEST 2V
2 series · 2 of 2 positions shown · non-contrast
Comparison: Chest x-ray of April 06, 2017

CLINICAL DATA: History of then talk procedure on March 21, 2017.
Follow-up study. No current chest complaints.

EXAM:
CHEST  2 VIEW

[w chest pa]
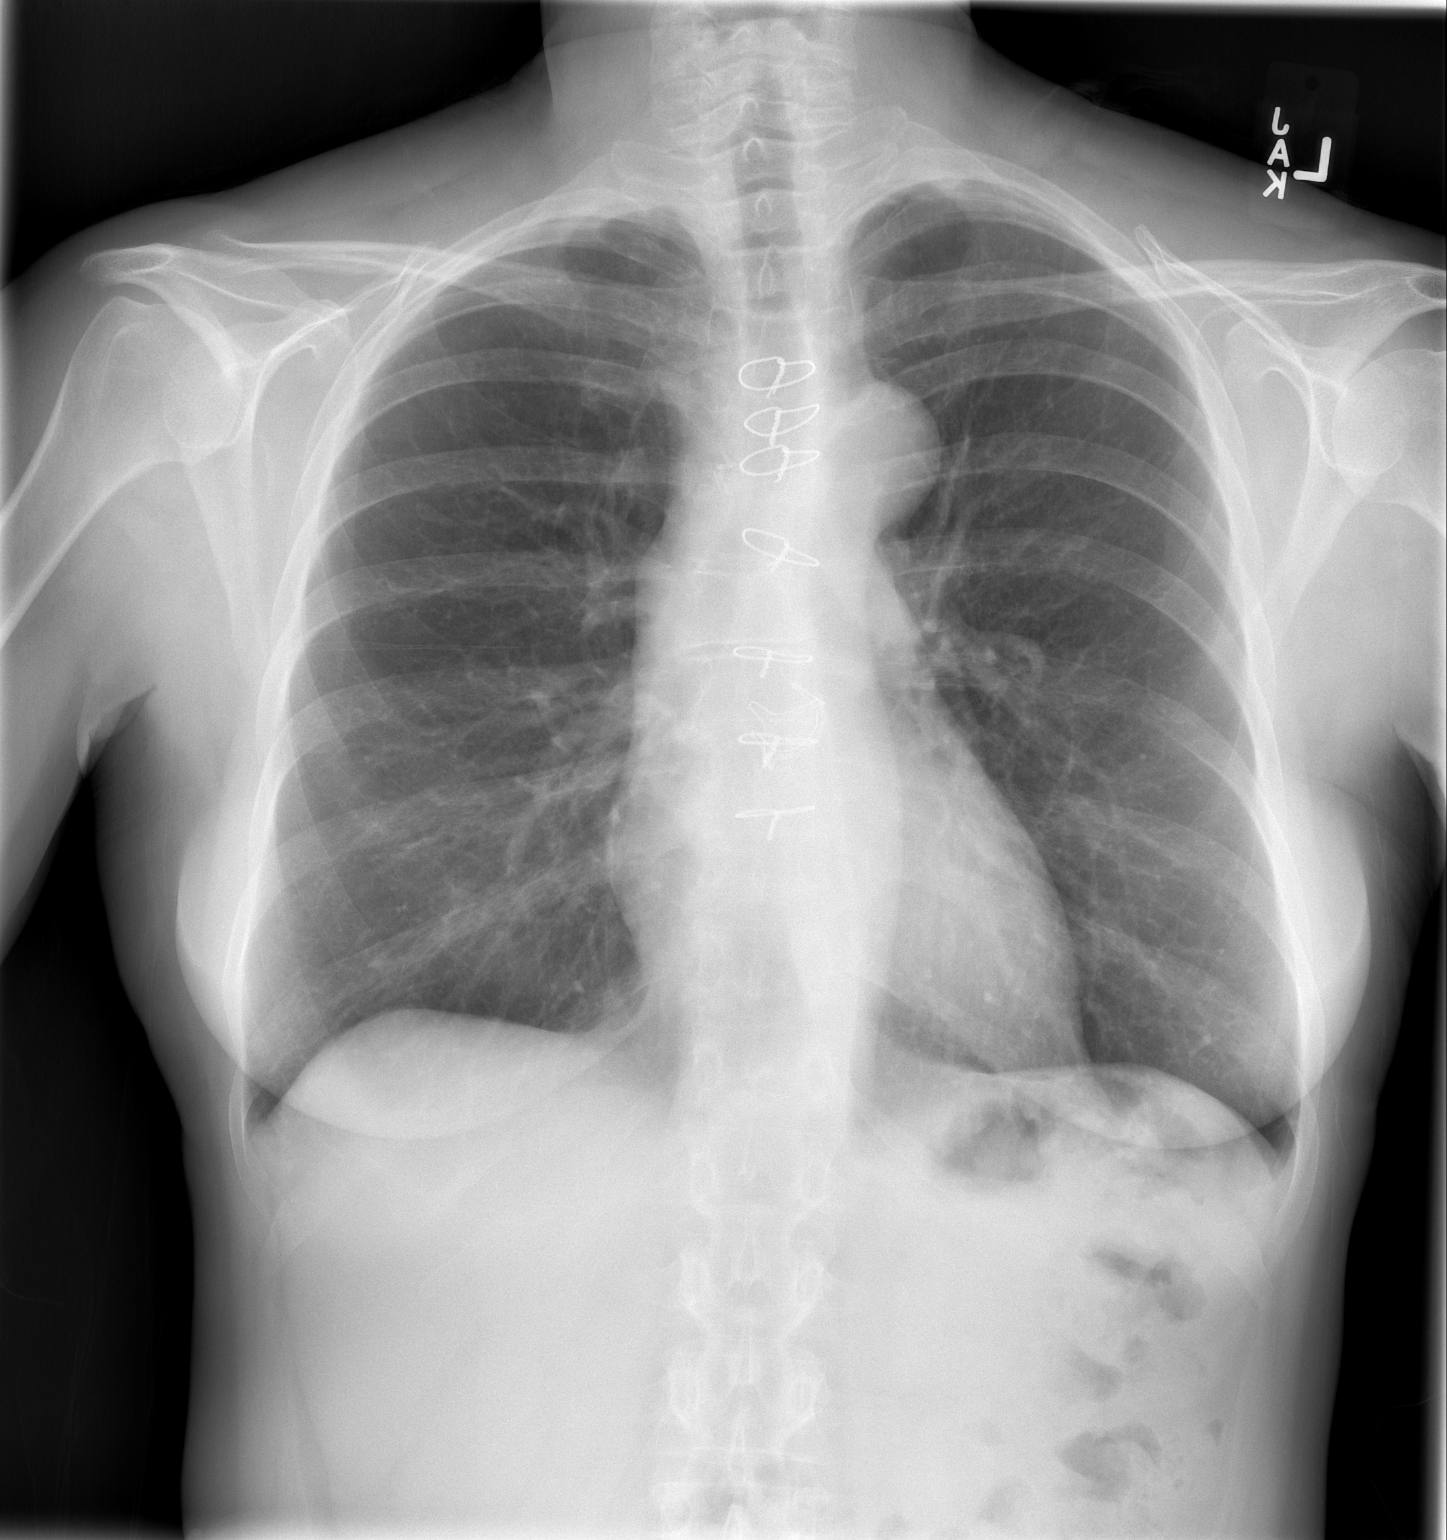

[w chest lat]
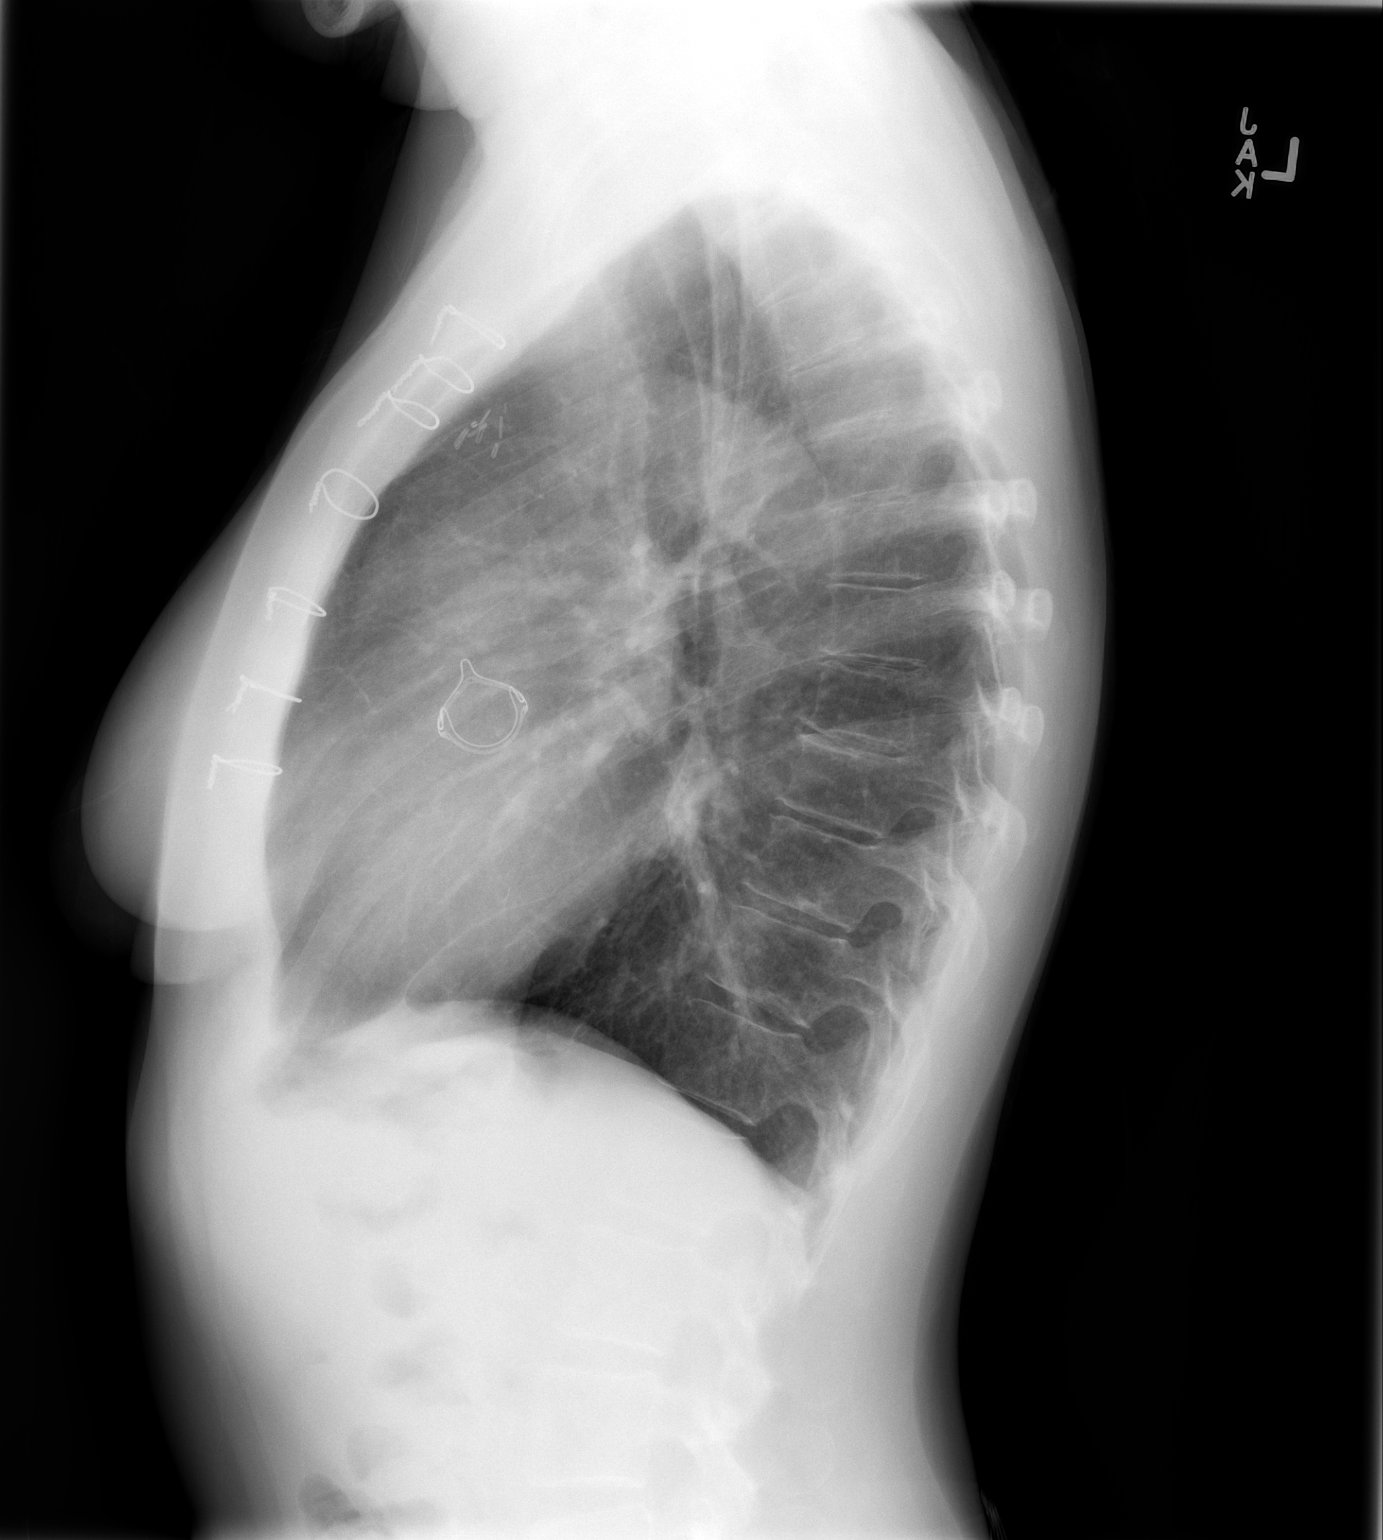

[2 of 2 positions shown; findings below may reference images not displayed]

FINDINGS: The lungs are well-expanded and clear. There is no pleural effusion
or pneumothorax. The heart and pulmonary vascularity are normal. The
aortic valve ring is in stable position. The sternal wires are
intact. There is mild multilevel degenerative disc space narrowing
of the thoracic spine.
IMPRESSION: There is no acute cardiopulmonary abnormality.  No CHF.

## 2017-11-26 DIAGNOSIS — Z23 Encounter for immunization: Secondary | ICD-10-CM | POA: Diagnosis not present

## 2017-11-26 DIAGNOSIS — Z0000 Encounter for general adult medical examination without abnormal findings: Secondary | ICD-10-CM | POA: Diagnosis not present

## 2017-12-02 DIAGNOSIS — E559 Vitamin D deficiency, unspecified: Secondary | ICD-10-CM | POA: Diagnosis not present

## 2017-12-02 DIAGNOSIS — Z0000 Encounter for general adult medical examination without abnormal findings: Secondary | ICD-10-CM | POA: Diagnosis not present

## 2018-02-12 NOTE — Telephone Encounter (Signed)
This encounter was created in error - please disregard.

## 2018-03-19 DIAGNOSIS — I350 Nonrheumatic aortic (valve) stenosis: Secondary | ICD-10-CM | POA: Diagnosis not present

## 2018-03-19 DIAGNOSIS — R300 Dysuria: Secondary | ICD-10-CM | POA: Diagnosis not present

## 2018-03-19 DIAGNOSIS — I481 Persistent atrial fibrillation: Secondary | ICD-10-CM | POA: Diagnosis not present

## 2018-03-19 DIAGNOSIS — J309 Allergic rhinitis, unspecified: Secondary | ICD-10-CM | POA: Diagnosis not present

## 2018-03-19 DIAGNOSIS — N390 Urinary tract infection, site not specified: Secondary | ICD-10-CM | POA: Diagnosis not present

## 2018-05-13 ENCOUNTER — Ambulatory Visit: Payer: BLUE CROSS/BLUE SHIELD | Admitting: Physician Assistant

## 2018-05-14 ENCOUNTER — Ambulatory Visit (INDEPENDENT_AMBULATORY_CARE_PROVIDER_SITE_OTHER): Payer: BLUE CROSS/BLUE SHIELD | Admitting: Physician Assistant

## 2018-05-14 ENCOUNTER — Encounter: Payer: Self-pay | Admitting: Physician Assistant

## 2018-05-14 ENCOUNTER — Encounter

## 2018-05-14 VITALS — BP 98/70 | HR 65 | Ht 64.0 in | Wt 132.0 lb

## 2018-05-14 DIAGNOSIS — I480 Paroxysmal atrial fibrillation: Secondary | ICD-10-CM | POA: Diagnosis not present

## 2018-05-14 DIAGNOSIS — Z952 Presence of prosthetic heart valve: Secondary | ICD-10-CM | POA: Diagnosis not present

## 2018-05-14 DIAGNOSIS — R42 Dizziness and giddiness: Secondary | ICD-10-CM

## 2018-05-14 NOTE — Progress Notes (Signed)
Cardiology Office Note    Date:  05/14/2018   ID:  Jessica [name], DOB 05/05/1961, MRN [id number]  PCP:  Deland Pretty, MD  Cardiologist: Jenkins Rouge, MD EPS: None  No chief complaint on file.   History of Present Illness:  Jessica [name] is a 57 y.o. female with history of bicuspid aortic valve disease status post Bentall procedure with pericardial valve in 26 mm Gelweave Valsalva graft for a sending aneurysm 03/21/2017 with Dr. Cyndia Bent.  Left heart cath showed normal coronary arteries and normal LVEF 60 to 65%.  She did have PAF but [place] vas score equals 1 for female sex.  She was on Eliquis for 3 to 4 weeks and then plan was for aspirin.  Last saw Dr. Johnsie Cancel 07/12/2017 and was doing well.  Patient had 2 weeks when she was dizzy in am . Exercising a lot, weight lifting, yoga.  Trying to eat clean.  Thinks she is staying hydrated but may have gotten a little on the dry side for those 2 weeks.  Doing better.  Labs in April were normal including CBC.  Has occasional palpitations and uses metoprolol may be once every 2 months.  Denies chest pain, dyspnea, dyspnea on exertion, edema.    Past Medical History:  Diagnosis Date  . Anxiety   . Aortic stenosis   . Bicuspid aortic valve    Previously seen in [place]; moderate AS and mild AR  . Chronic neck pain   . Congenital heart valve abnormality   . Dyspnea    with exertion  . Heart murmur   . Paroxysmal atrial fibrillation (HCC)   . PONV (postoperative nausea and vomiting)   . Vertigo     Past Surgical History:  Procedure Laterality Date  . abscess excision     Right thigh  . BENTALL PROCEDURE N/A 03/21/2017   Procedure: BENTALL PROCEDURE;  Surgeon: Gaye Pollack, MD;  Location: [facility] OR;  Service: Open Heart Surgery;  Laterality: N/A;  CIRC ARREST  LEFT RADIAL A-LINE  . ENDOMETRIAL ABLATION    . HERNIA REPAIR    . KNEE SURGERY     Multiple  . LAPAROSCOPY     x2  . MEDIASTINAL EXPLORATION N/A 03/21/2017   Procedure:  MEDIASTINAL EXPLORATION FOR BLEEDING;  Surgeon: Gaye Pollack, MD;  Location: [facility];  Service: Thoracic;  Laterality: N/A;  . TEE WITHOUT CARDIOVERSION N/A 03/21/2017   Procedure: TRANSESOPHAGEAL ECHOCARDIOGRAM (TEE);  Surgeon: Gaye Pollack, MD;  Location: [facility];  Service: Open Heart Surgery;  Laterality: N/A;  . TUBAL LIGATION    . VENTRICULAR ANEURYSM RESECTION N/A 03/21/2017   Procedure: RESECTION OF ASCENDING ANEURYSM;  Surgeon: Gaye Pollack, MD;  Location: [facility];  Service: Open Heart Surgery;  Laterality: N/A;    Current Medications: Current Meds  Medication Sig  . aspirin EC 325 MG tablet Take 325 mg by mouth daily.  . metoprolol tartrate (LOPRESSOR) 25 MG tablet Take 1 tablet (25 mg total) by mouth as needed (palpitations).  . Multiple Vitamin (MULTIVITAMIN WITH MINERALS) TABS tablet Take 1 tablet by mouth daily.  . Scar Treatment Products (MEDERMA) GEL Apply 1 application topically daily.     Allergies:   Penicillins   Social History   Socioeconomic History  . Marital status: Married    Spouse name: Not on file  . Number of children: 3  . Years of education: Not on file  . Highest education level: Not on file  Occupational History  .  Not on file  Social Needs  . Financial resource strain: Not on file  . Food insecurity:    Worry: Not on file    Inability: Not on file  . Transportation needs:    Medical: Not on file    Non-medical: Not on file  Tobacco Use  . Smoking status: Former Smoker    Last attempt to quit: 09/03/2009    Years since quitting: 8.6  . Smokeless tobacco: Never Used  . Tobacco comment: Quit using Chantix.  Substance and Sexual Activity  . Alcohol use: Yes    Comment: daily 2-3 glasses of wine  . Drug use: No  . Sexual activity: Not on file  Lifestyle  . Physical activity:    Days per week: Not on file    Minutes per session: Not on file  . Stress: Not on file  Relationships  . Social connections:    Talks on phone: Not on file     Gets together: Not on file    Attends religious service: Not on file    Active member of club or organization: Not on file    Attends meetings of clubs or organizations: Not on file    Relationship status: Not on file  Other Topics Concern  . Not on file  Social History Narrative   Moved from [place], husband is in Museum/gallery conservator.     Family History:  The patient's family history includes Breast cancer in her paternal grandmother; Heart attack in her father and mother; Hypertension in her father and mother.   ROS:   Please see the history of present illness.    Review of Systems  Constitution: Negative.  HENT: Negative.   Eyes: Negative.   Cardiovascular: Negative.   Respiratory: Negative.   Hematologic/Lymphatic: Bruises/bleeds easily.  Musculoskeletal: Negative.  Negative for joint pain.  Gastrointestinal: Negative.   Genitourinary: Negative.   Neurological: Positive for dizziness.   All other systems reviewed and are negative.   PHYSICAL EXAM:   VS:  BP 98/70 (BP Location: Left Arm, Patient Position: Sitting, Cuff Size: Normal)   Pulse 65   Ht 5\' 4"  (1.626 m)   Wt 132 lb (59.9 kg)   BMI 22.66 kg/m   Physical Exam  GEN: Thin, in no acute distress  Neck: no JVD, carotid bruits, or masses Cardiac:RRR; crisp valve sounds, 2/6 systolic murmur left sternal border Respiratory:  clear to auscultation bilaterally, normal work of breathing GI: soft, nontender, nondistended, + BS Ext: without cyanosis, clubbing, or edema, Good distal pulses bilaterally Neuro:  Alert and Oriented x 3 Psych: euthymic mood, full affect  Wt Readings from Last 3 Encounters:  05/14/18 132 lb (59.9 kg)  07/12/17 134 lb 4 oz (60.9 kg)  05/08/17 127 lb (57.6 kg)      Studies/Labs Reviewed:   EKG:  EKG is ordered today.  The ekg ordered today demonstrates normal sinus rhythm, PAC, no acute change  Recent Labs: No results found for requested labs within last 8760 hours.   Lipid Panel No  results found for: CHOL, TRIG, HDL, CHOLHDL, VLDL, LDLCALC, LDLDIRECT  Additional studies/ records that were reviewed today include:  2D echo 8/14/2018Study Conclusions   - Left ventricle: The cavity size was normal. Systolic function was   normal. The estimated ejection fraction was in the range of 60%   to 65%. Wall motion was normal; there were no regional wall   motion abnormalities. Left ventricular diastolic function   parameters were normal. - Aortic  valve: A bioprosthesis was present and functioning   normally. Transvalvular velocity was within the normal range.   There was no stenosis. There was trivial perivalvular   regurgitation. Mean gradient (S): 10 mm Hg. Valve area (VTI):   1.57 cm^2. Valve area (Vmax): 1.39 cm^2. Valve area (Vmean): 1.46   cm^2. - Mitral valve: Transvalvular velocity was within the normal range.   There was no evidence for stenosis. There was trivial   regurgitation. - Left atrium: The atrium was mildly dilated. - Right ventricle: The cavity size was normal. Wall thickness was   normal. Systolic function was normal. - Atrial septum: No defect or patent foramen ovale was identified. - Tricuspid valve: There was mild-moderate regurgitation. - Pulmonary arteries: Systolic pressure was within the normal   range. PA peak pressure: 21 mm Hg (S). - Pericardium, extracardiac: A trivial pericardial effusion was   identified.   Cardiac catheterization 4/2018There is moderate (3+) aortic regurgitation.   1. Widely patent coronary arteries 2. Bicuspid aortic valve with fluoroscopic evidence of heavy calcification and restricted leaflet mobility, known severe aortic stenosis by noninvasive assessment, and 3+ aortic insufficiency by aortic root angiography 3. Normal right heart hemodynamics      ASSESSMENT:    1. S/P aortic valve replacement and aortoplasty   2. Paroxysmal atrial fibrillation (HCC)   3. Dizziness      PLAN:  In order of problems  listed above:  Status post aortic valve and root replacement with pericardial valve and Valsalva graft 03/21/2017.  Did not require CABG as she had normal coronary arteries.  Follow-up with Dr. Johnsie Cancel in 6 months.  PAF CHA2DS2-VASc score equals 1 rare palpitations requiring metoprolol  Dizziness occurred in the early morning when she first got up.  Her blood pressure is running low.  She will continue to check it at home and liberalize her salt.  Blood work was normal on April.    Medication Adjustments/Labs and Tests Ordered: Current medicines are reviewed at length with the patient today.  Concerns regarding medicines are outlined above.  Medication changes, Labs and Tests ordered today are listed in the Patient Instructions below. Patient Instructions  Medication Instructions:  Your physician recommends that you continue on your current medications as directed. Please refer to the Current Medication list given to you today.   Labwork: None ordered  Testing/Procedures: None ordered  Follow-Up: Your physician wants you to follow-up in: 6 months with Dr. Johnsie Cancel. You will receive a reminder letter in the mail two months in advance. If you don't receive a letter, please call our office to schedule the follow-up appointment.   Any Other Special Instructions Will Be Listed Below (If Applicable).     If you need a refill on your cardiac medications before your next appointment, please call your pharmacy.      Sumner Boast, PA-C  05/14/2018 1:06 PM    [facility] [place], [place], [place]  [address] Phone: [phone]; Fax: [phone]

## 2018-05-14 NOTE — Patient Instructions (Signed)
Medication Instructions:  Your physician recommends that you continue on your current medications as directed. Please refer to the Current Medication list given to you today.   Labwork: None ordered  Testing/Procedures: None ordered  Follow-Up: Your physician wants you to follow-up in: 6 months with Dr. Nishan. You will receive a reminder letter in the mail two months in advance. If you don't receive a letter, please call our office to schedule the follow-up appointment.   Any Other Special Instructions Will Be Listed Below (If Applicable).     If you need a refill on your cardiac medications before your next appointment, please call your pharmacy.   

## 2018-07-16 DIAGNOSIS — J309 Allergic rhinitis, unspecified: Secondary | ICD-10-CM | POA: Diagnosis not present

## 2018-11-13 ENCOUNTER — Other Ambulatory Visit (HOSPITAL_COMMUNITY): Payer: Self-pay | Admitting: Physician Assistant

## 2019-01-09 ENCOUNTER — Telehealth: Payer: Self-pay | Admitting: Cardiology

## 2019-01-09 NOTE — Telephone Encounter (Signed)
New message     Virtual video visit scheduled for 01-15-19.  Pt asked to have recent bp reading available for nurse.  Patient gave consent for video visit.  YOUR CARDIOLOGY TEAM HAS ARRANGED FOR AN E-VISIT FOR YOUR APPOINTMENT - PLEASE REVIEW IMPORTANT INFORMATION BELOW SEVERAL DAYS PRIOR TO YOUR APPOINTMENT  Due to the recent COVID-19 pandemic, we are transitioning in-person office visits to tele-medicine visits in an effort to decrease unnecessary exposure to our patients, their families, and staff. These visits are billed to your insurance just like a normal visit is. We also encourage you to sign up for MyChart if you have not already done so. You will need a smartphone if possible. For patients that do not have this, we can still complete the visit using a regular telephone but do prefer a smartphone to enable video when possible. You may have a family member that lives with you that can help. If possible, we also ask that you have a blood pressure cuff and scale at home to measure your blood pressure, heart rate and weight prior to your scheduled appointment. Patients with clinical needs that need an in-person evaluation and testing will still be able to come to the office if absolutely necessary. If you have any questions, feel free to call our office.     YOUR PROVIDER WILL BE USING THE FOLLOWING PLATFORM TO COMPLETE YOUR VISIT: Doximity   IF USING MYCHART - How to Download the MyChart App to Your SmartPhone   - If Apple, go to [employer] and type in MyChart in the search bar and download the app. If Android, ask patient to go to [employer] and type in [place] in the search bar and download the app. The app is free but as with any other app downloads, your phone may require you to verify saved payment information or Apple/Android password.  - You will need to then log into the app with your MyChart username and password, and select [facility] as your healthcare provider to link the  account.  - When it is time for your visit, go to the MyChart app, find appointments, and click Begin Video Visit. Be sure to Select Allow for your device to access the Microphone and Camera for your visit. You will then be connected, and your provider will be with you shortly.  **If you have any issues connecting or need assistance, please contact MyChart service desk [phone])**  **If using a computer, in order to ensure the best quality for your visit, you will need to use either of the following Internet Browsers: Insurance underwriter or Microsoft Edge**   IF USING DOXIMITY or [email or web address] - The staff will give you instructions on receiving your link to join the meeting the day of your visit.      2-3 DAYS BEFORE YOUR APPOINTMENT  You will receive a telephone call from one of our [facility] team members - your caller ID may say "Unknown caller." If this is a video visit, we will walk you through how to get the video launched on your phone. We will remind you check your blood pressure, heart rate and weight prior to your scheduled appointment. If you have an Apple Watch or Kardia, please upload any pertinent ECG strips the day before or morning of your appointment to [facility]. Our staff will also make sure you have reviewed the consent and agree to move forward with your scheduled tele-health visit.     THE DAY OF  YOUR APPOINTMENT  Approximately 15 minutes prior to your scheduled appointment, you will receive a telephone call from one of [facility] team - your caller ID may say "Unknown caller."  Our staff will confirm medications, vital signs for the day and any symptoms you may be experiencing. Please have this information available prior to the time of visit start. It may also be helpful for you to have a pad of paper and pen handy for any instructions given during your visit. They will also walk you through joining the smartphone meeting if this is a video visit.    CONSENT FOR  TELE-HEALTH VISIT - PLEASE REVIEW  I hereby voluntarily request, consent and authorize [facility] and its employed or contracted physicians, physician assistants, nurse practitioners or other licensed health care professionals (the Practitioner), to provide me with telemedicine health care services (the Services") as deemed necessary by the treating Practitioner. I acknowledge and consent to receive the Services by the Practitioner via telemedicine. I understand that the telemedicine visit will involve communicating with the Practitioner through live audiovisual communication technology and the disclosure of certain medical information by electronic transmission. I acknowledge that I have been given the opportunity to request an in-person assessment or other available alternative prior to the telemedicine visit and am voluntarily participating in the telemedicine visit.  I understand that I have the right to withhold or withdraw my consent to the use of telemedicine in the course of my care at any time, without affecting my right to future care or treatment, and that the Practitioner or I may terminate the telemedicine visit at any time. I understand that I have the right to inspect all information obtained and/or recorded in the course of the telemedicine visit and may receive copies of available information for a reasonable fee.  I understand that some of the potential risks of receiving the Services via telemedicine include:   Delay or interruption in medical evaluation due to technological equipment failure or disruption;  Information transmitted may not be sufficient (e.g. poor resolution of images) to allow for appropriate medical decision making by the Practitioner; and/or   In rare instances, security protocols could fail, causing a breach of personal health information.  Furthermore, I acknowledge that it is my responsibility to provide information about my medical history, conditions and  care that is complete and accurate to the best of my ability. I acknowledge that Practitioner's advice, recommendations, and/or decision may be based on factors not within their control, such as incomplete or inaccurate data provided by me or distortions of diagnostic images or specimens that may result from electronic transmissions. I understand that the practice of medicine is not an exact science and that Practitioner makes no warranties or guarantees regarding treatment outcomes. I acknowledge that I will receive a copy of this consent concurrently upon execution via email to the email address I last provided but may also request a printed copy by calling the office of [facility].    I understand that my insurance will be billed for this visit.   I have read or had this consent read to me.  I understand the contents of this consent, which adequately explains the benefits and risks of the Services being provided via telemedicine.   I have been provided ample opportunity to ask questions regarding this consent and the Services and have had my questions answered to my satisfaction.  I give my informed consent for the services to be provided through the use of telemedicine  in my medical care  By participating in this telemedicine visit I agree to the above.

## 2019-01-15 ENCOUNTER — Encounter: Payer: Self-pay | Admitting: Cardiology

## 2019-01-15 ENCOUNTER — Telehealth: Payer: Self-pay

## 2019-01-15 ENCOUNTER — Telehealth (INDEPENDENT_AMBULATORY_CARE_PROVIDER_SITE_OTHER): Payer: BLUE CROSS/BLUE SHIELD | Admitting: Cardiology

## 2019-01-15 ENCOUNTER — Other Ambulatory Visit: Payer: Self-pay

## 2019-01-15 VITALS — BP 108/79 | HR 80 | Ht 64.0 in | Wt 128.0 lb

## 2019-01-15 DIAGNOSIS — R42 Dizziness and giddiness: Secondary | ICD-10-CM | POA: Diagnosis not present

## 2019-01-15 DIAGNOSIS — Z952 Presence of prosthetic heart valve: Secondary | ICD-10-CM

## 2019-01-15 DIAGNOSIS — I480 Paroxysmal atrial fibrillation: Secondary | ICD-10-CM | POA: Diagnosis not present

## 2019-01-15 NOTE — Progress Notes (Signed)
Virtual Visit via Video Note   This visit type was conducted due to national recommendations for restrictions regarding the COVID-19 Pandemic (e.g. social distancing) in an effort to limit this patient's exposure and mitigate transmission in our community.  Due to her co-morbid illnesses, this patient is at least at moderate risk for complications without adequate follow up.  This format is felt to be most appropriate for this patient at this time.  All issues noted in this document were discussed and addressed.  A limited physical exam was performed with this format.  Please refer to the patient's chart for her consent to telehealth for [facility].   Date:  01/15/2019   ID:  Jessica [name], DOB 1960/10/07, MRN [id number]  Patient Location: Other:  her car Provider Location: Office  PCP:  Deland Pretty, MD  Cardiologist:  Jenkins Rouge, MD  Electrophysiologist:  None   Evaluation Performed:  Follow-Up Visit  Chief Complaint:  AVR, PAF  History of Present Illness:    Jessica [name] is a 58 y.o. female with hx of bicuspid aortic valve disease status post Bentall procedure with pericardial valve in 26 mm Gelweave Valsalva graft for a sending aneurysm 03/21/2017 with Dr. Cyndia Bent.  Left heart cath showed normal coronary arteries and normal LVEF 60 to 65%.  She did have PAF but [place] vas score equals 1 for female sex.  She was on Eliquis for 3 to 4 weeks and then plan was for aspirin.  Last saw Dr. Johnsie Cancel 07/12/2017 and was doing well.  Last visit 05/14/18 was stable except with 2 weeks of dizziness in AM thought she was dry.   Today she was in her car with husband.  She feels great. No chest pain or angina.  No SOB.  occ dizziness room spinning last 20 sec at most.  No awareness of a fib. No edema. Eats healthy and exercises daily, cardio and weights.  She wears mask and washes hands freq if out.  Her husband had been traveling to [place] and [place] just prior to outbreak and no symptoms.  They  are grateful.     The patient does not have symptoms concerning for COVID-19 infection (fever, chills, cough, or new shortness of breath).    Past Medical History:  Diagnosis Date  . Anxiety   . Aortic stenosis   . Bicuspid aortic valve    Previously seen in [place]; moderate AS and mild AR  . Chronic neck pain   . Congenital heart valve abnormality   . Dyspnea    with exertion  . Heart murmur   . Paroxysmal atrial fibrillation (HCC)   . PONV (postoperative nausea and vomiting)   . Vertigo    Past Surgical History:  Procedure Laterality Date  . abscess excision     Right thigh  . BENTALL PROCEDURE N/A 03/21/2017   Procedure: BENTALL PROCEDURE;  Surgeon: Gaye Pollack, MD;  [facility];  Service: Open Heart Surgery;  Laterality: N/A;  CIRC ARREST  LEFT RADIAL A-LINE  . ENDOMETRIAL ABLATION    . HERNIA REPAIR    . KNEE SURGERY     Multiple  . LAPAROSCOPY     x2  . MEDIASTINAL EXPLORATION N/A 03/21/2017   Procedure: MEDIASTINAL EXPLORATION FOR BLEEDING;  Surgeon: Gaye Pollack, MD;  Location: [facility];  Service: Thoracic;  Laterality: N/A;  . TEE WITHOUT CARDIOVERSION N/A 03/21/2017   Procedure: TRANSESOPHAGEAL ECHOCARDIOGRAM (TEE);  Surgeon: Gaye Pollack, MD;  Location: [facility];  Service: Open Heart Surgery;  Laterality: N/A;  . TUBAL LIGATION    . VENTRICULAR ANEURYSM RESECTION N/A 03/21/2017   Procedure: RESECTION OF ASCENDING ANEURYSM;  Surgeon: Gaye Pollack, MD;  Location: [facility];  Service: Open Heart Surgery;  Laterality: N/A;     Current Meds  Medication Sig  . aspirin EC 81 MG tablet Take 81 mg by mouth daily.  . metoprolol tartrate (LOPRESSOR) 25 MG tablet TAKE 1 TABLET (25 MG TOTAL) BY MOUTH AS NEEDED (PALPITATIONS).  . Multiple Vitamin (MULTIVITAMIN WITH MINERALS) TABS tablet Take 1 tablet by mouth daily.  Marland Kitchen VITAMIN D PO Take 1 capsule by mouth daily.     Allergies:   Penicillins   Social History   Tobacco Use  . Smoking status: Former Smoker    Last  attempt to quit: 09/03/2009    Years since quitting: 9.3  . Smokeless tobacco: Never Used  . Tobacco comment: Quit using Chantix.  Substance Use Topics  . Alcohol use: Yes    Comment: daily 2-3 glasses of wine  . Drug use: No     Family Hx: The patient's family history includes Breast cancer in her paternal grandmother; Heart attack in her father and mother; Hypertension in her father and mother.  ROS:   Please see the history of present illness.    General:no colds or fevers, no weight changes Skin:no rashes or ulcers HEENT:no blurred vision, no congestion CV:see HPI PUL:see HPI GI:no diarrhea constipation or melena, no indigestion GU:no hematuria, no dysuria MS:no joint pain, no claudication Neuro:no syncope, no lightheadedness Endo:no diabetes, no thyroid disease  All other systems reviewed and are negative.   Prior CV studies:   The following studies were reviewed today:  2D echo 04/16/2017 Study Conclusions  - Left ventricle: The cavity size was normal. Systolic function was normal. The estimated ejection fraction was in the range of 60% to 65%. Wall motion was normal; there were no regional wall motion abnormalities. Left ventricular diastolic function parameters were normal. - Aortic valve: A bioprosthesis was present and functioning normally. Transvalvular velocity was within the normal range. There was no stenosis. There was trivial perivalvular regurgitation. Mean gradient (S): 10 mm Hg. Valve area (VTI): 1.57 cm^2. Valve area (Vmax): 1.39 cm^2. Valve area (Vmean): 1.46 cm^2. - Mitral valve: Transvalvular velocity was within the normal range. There was no evidence for stenosis. There was trivial regurgitation. - Left atrium: The atrium was mildly dilated. - Right ventricle: The cavity size was normal. Wall thickness was normal. Systolic function was normal. - Atrial septum: No defect or patent foramen ovale was identified. -  Tricuspid valve: There was mild-moderate regurgitation. - Pulmonary arteries: Systolic pressure was within the normal range. PA peak pressure: 21 mm Hg (S). - Pericardium, extracardiac: A trivial pericardial effusion was identified.   Cardiac catheterization 4/2018There is moderate (3+) aortic regurgitation.  1. Widely patent coronary arteries 2. Bicuspid aortic valve with fluoroscopic evidence of heavy calcification and restricted leaflet mobility, known severe aortic stenosis by noninvasive assessment, and 3+ aortic insufficiency by aortic root angiography 3. Normal right heart hemodynamics    Labs/Other Tests and Data Reviewed:    EKG:  An ECG dated 05/14/18 was personally reviewed today and demonstrated:  SR with PACs  Recent Labs: No results found for requested labs within last 8760 hours.   Recent Lipid Panel No results found for: CHOL, TRIG, HDL, CHOLHDL, LDLCALC, LDLDIRECT  Wt Readings from Last 3 Encounters:  01/15/19 128 lb (58.1 kg)  05/14/18 132 lb (59.9 kg)  07/12/17 134 lb 4 oz (60.9 kg)     Objective:    Vital Signs:  BP 108/79   Pulse 80   Ht 5\' 4"  (1.626 m)   Wt 128 lb (58.1 kg)   BMI 21.97 kg/m    VITAL SIGNS:  reviewed  General female of stated age and NAD Neuro: A&O X 3 Follows commands Lungs no SOB with talking, completes sentences Psych: pleasant affect   ASSESSMENT & PLAN:    S/p aortic valve replacement and aortoplasty  No edema no SOB, normal coronary arteries on cath prior to surgery.   PAF no episodes - CHA2DS2VASc score of 1   Dizziness, still with episodes, BP soft she keeps hydrated.      COVID-19 Education: The signs and symptoms of COVID-19 were discussed with the patient and how to seek care for testing (follow up with PCP or arrange E-visit).  The importance of social distancing was discussed today.  Time:   Today, I have spent 10 minutes with the patient with telehealth technology discussing the above problems.      Medication Adjustments/Labs and Tests Ordered: Current medicines are reviewed at length with the patient today.  Concerns regarding medicines are outlined above.   Tests Ordered: No orders of the defined types were placed in this encounter.   Medication Changes: No orders of the defined types were placed in this encounter.   Disposition:  Follow up in 6 month(s)  Signed, Cecilie Kicks, NP  01/15/2019 2:28 PM    [facility]

## 2019-01-15 NOTE — Patient Instructions (Signed)
Medication Instructions:  Your physician recommends that you continue on your current medications as directed. Please refer to the Current Medication list given to you today.  If you need a refill on your cardiac medications before your next appointment, please call your pharmacy.   Lab work: NONE If you have labs (blood work) drawn today and your tests are completely normal, you will receive your results only by: . MyChart Message (if you have MyChart) OR . A paper copy in the mail If you have any lab test that is abnormal or we need to change your treatment, we will call you to review the results.  Testing/Procedures: NONE  Follow-Up: At [facility], you and your health needs are our priority.  As part of our continuing mission to provide you with exceptional heart care, we have created designated Provider Care Teams.  These Care Teams include your primary Cardiologist (physician) and Advanced Practice Providers (APPs -  Physician Assistants and Nurse Practitioners) who all work together to provide you with the care you need, when you need it. You will need a follow up appointment in 6 months.  Please call our office 2 months in advance to schedule this appointment.  You may see Peter Nishan, MD or one of the following Advanced Practice Providers on your designated Care Team:   Lori Gerhardt, NP Izetta Ingold, NP . Jill McDaniel, NP    

## 2019-01-15 NOTE — Telephone Encounter (Signed)

## 2019-02-11 ENCOUNTER — Other Ambulatory Visit: Payer: Self-pay | Admitting: Physician Assistant

## 2019-03-20 ENCOUNTER — Telehealth: Payer: Self-pay | Admitting: Cardiovascular Disease

## 2019-03-20 NOTE — Telephone Encounter (Signed)
Pt broke tooth today, will be going to dentist, pt has  Clindamycin at home and is aware to take one hour before her dentist app.

## 2019-03-20 NOTE — Telephone Encounter (Signed)
New message:    Patient calling stating her tooth broke and she have had open heart surgery and want to know if he can antibiotics please call patient.

## 2019-06-10 ENCOUNTER — Telehealth: Payer: Self-pay | Admitting: Cardiovascular Disease

## 2019-06-10 DIAGNOSIS — Z952 Presence of prosthetic heart valve: Secondary | ICD-10-CM

## 2019-06-10 DIAGNOSIS — I359 Nonrheumatic aortic valve disorder, unspecified: Secondary | ICD-10-CM

## 2019-06-10 NOTE — Telephone Encounter (Signed)
Will see if Dr. Johnsie Cancel wants echo before patient's appointment next month.

## 2019-06-10 NOTE — Telephone Encounter (Signed)
Ok to order echo before visit for AVR

## 2019-06-10 NOTE — Telephone Encounter (Signed)
° ° °  Patient request order for echo. Please call

## 2019-06-10 NOTE — Telephone Encounter (Signed)
Called patient to let her know that an order will be put in for echo. Informed patient that someone will call her to schedule. Will try to make the echo appt on the same day as her office visit. Patient verbalized understanding.

## 2019-06-15 ENCOUNTER — Other Ambulatory Visit (HOSPITAL_COMMUNITY): Payer: BC Managed Care – PPO

## 2019-06-24 ENCOUNTER — Ambulatory Visit (HOSPITAL_COMMUNITY): Payer: BC Managed Care – PPO | Attending: Cardiology

## 2019-06-24 ENCOUNTER — Other Ambulatory Visit: Payer: Self-pay

## 2019-06-24 DIAGNOSIS — Z0000 Encounter for general adult medical examination without abnormal findings: Secondary | ICD-10-CM | POA: Diagnosis not present

## 2019-06-24 DIAGNOSIS — I359 Nonrheumatic aortic valve disorder, unspecified: Secondary | ICD-10-CM | POA: Diagnosis not present

## 2019-06-24 DIAGNOSIS — E7800 Pure hypercholesterolemia, unspecified: Secondary | ICD-10-CM | POA: Diagnosis not present

## 2019-06-24 DIAGNOSIS — Z952 Presence of prosthetic heart valve: Secondary | ICD-10-CM

## 2019-06-29 DIAGNOSIS — R300 Dysuria: Secondary | ICD-10-CM | POA: Diagnosis not present

## 2019-06-29 DIAGNOSIS — R319 Hematuria, unspecified: Secondary | ICD-10-CM | POA: Diagnosis not present

## 2019-07-06 DIAGNOSIS — Z0000 Encounter for general adult medical examination without abnormal findings: Secondary | ICD-10-CM | POA: Diagnosis not present

## 2019-07-10 ENCOUNTER — Telehealth: Payer: Self-pay

## 2019-07-10 DIAGNOSIS — E785 Hyperlipidemia, unspecified: Secondary | ICD-10-CM

## 2019-07-10 MED ORDER — ATORVASTATIN CALCIUM 10 MG PO TABS: 5.0000 mg | ORAL_TABLET | Freq: Every day | ORAL | 3 refills | Status: DC

## 2019-07-10 NOTE — Telephone Encounter (Signed)
Patient called back about lab results. Patient will get lab work repeated in April at [facility] in [place] 5 mg by mouth daily to patient's pharmacy of choice.

## 2019-07-10 NOTE — Telephone Encounter (Signed)
Left message for patient to call back  

## 2019-07-10 NOTE — Progress Notes (Signed)
Cardiology Office Note    Date:  07/15/2019   ID:  Jessica [name], DOB 06/02/1961, MRN [id number]  PCP:  Deland Pretty, MD  Cardiologist: Jenkins Rouge, MD EPS: None  No chief complaint on file.   History of Present Illness:  Jessica [name] is a 58 y.o. female with history of bicuspid aortic valve disease status post Bentall procedure with pericardial valve 26 mm Gelweave Valsalva graft for a sending aneurysm 03/21/2017 with Dr. Cyndia Bent.  Left heart cath showed normal coronary arteries and normal LVEF 60 to 65%.  She did have PAF but [place] vas score equals 1 for female sex.  She was on Eliquis for 3 to 4 weeks and then plan was for aspirin.   Marland Kitchen  Exercising a lot, weight lifting, yoga.  Trying to eat clean  Has occasional palpitations and uses metoprolol may be once every 2 months.  Denies chest pain, dyspnea, dyspnea on exertion, edema.  06/29/19 LDL 137 started on statin in light of her tissue valve   Echo reviewed 06/24/19  EF 55% Mild LAE  AVR with mean gradient 12 mmHg peak 24 mmHg DVI normal 0.48  no AR  Husband works for [employer] and normally travels all over world She has had flu shote and is interested vaccine for virus when available Dentition good Needs colonoscopy ? With Clarene Essex and she is ok to have this with no SBE prophylaxis currently recommended    Past Medical History:  Diagnosis Date  . Anxiety   . Aortic stenosis   . Bicuspid aortic valve    Previously seen in [place]; moderate AS and mild AR  . Chronic neck pain   . Congenital heart valve abnormality   . Dyspnea    with exertion  . Heart murmur   . Paroxysmal atrial fibrillation (HCC)   . PONV (postoperative nausea and vomiting)   . Vertigo     Past Surgical History:  Procedure Laterality Date  . abscess excision     Right thigh  . BENTALL PROCEDURE N/A 03/21/2017   Procedure: BENTALL PROCEDURE;  Surgeon: Gaye Pollack, MD;  Location: [facility] OR;  Service: Open Heart Surgery;  Laterality:  N/A;  CIRC ARREST  LEFT RADIAL A-LINE  . ENDOMETRIAL ABLATION    . HERNIA REPAIR    . KNEE SURGERY     Multiple  . LAPAROSCOPY     x2  . MEDIASTINAL EXPLORATION N/A 03/21/2017   Procedure: MEDIASTINAL EXPLORATION FOR BLEEDING;  Surgeon: Gaye Pollack, MD;  Location: [place];  Service: Thoracic;  Laterality: N/A;  . TEE WITHOUT CARDIOVERSION N/A 03/21/2017   Procedure: TRANSESOPHAGEAL ECHOCARDIOGRAM (TEE);  Surgeon: Gaye Pollack, MD;  Location: [place];  Service: Open Heart Surgery;  Laterality: N/A;  . TUBAL LIGATION    . VENTRICULAR ANEURYSM RESECTION N/A 03/21/2017   Procedure: RESECTION OF ASCENDING ANEURYSM;  Surgeon: Gaye Pollack, MD;  Location: [place];  Service: Open Heart Surgery;  Laterality: N/A;    Current Medications: Current Meds  Medication Sig  . aspirin EC 81 MG tablet Take 81 mg by mouth daily.  Marland Kitchen atorvastatin (LIPITOR) 10 MG tablet Take 0.5 tablets (5 mg total) by mouth daily at 6 PM.  . metoprolol tartrate (LOPRESSOR) 25 MG tablet TAKE 1 TABLET (25 MG TOTAL) BY MOUTH AS NEEDED (PALPITATIONS).  . Multiple Vitamin (MULTIVITAMIN WITH MINERALS) TABS tablet Take 1 tablet by mouth daily.  Marland Kitchen VITAMIN D PO Take 1 capsule by mouth daily.  Allergies:   Penicillins   Social History   Socioeconomic History  . Marital status: Married    Spouse name: Not on file  . Number of children: 3  . Years of education: Not on file  . Highest education level: Not on file  Occupational History  . Not on file  Social Needs  . Financial resource strain: Not on file  . Food insecurity    Worry: Not on file    Inability: Not on file  . Transportation needs    Medical: Not on file    Non-medical: Not on file  Tobacco Use  . Smoking status: Former Smoker    Quit date: 09/03/2009    Years since quitting: 9.8  . Smokeless tobacco: Never Used  . Tobacco comment: Quit using Chantix.  Substance and Sexual Activity  . Alcohol use: Yes    Comment: daily 2-3 glasses of wine  . Drug  use: No  . Sexual activity: Not on file  Lifestyle  . Physical activity    Days per week: Not on file    Minutes per session: Not on file  . Stress: Not on file  Relationships  . Social Herbalist on phone: Not on file    Gets together: Not on file    Attends religious service: Not on file    Active member of club or organization: Not on file    Attends meetings of clubs or organizations: Not on file    Relationship status: Not on file  Other Topics Concern  . Not on file  Social History Narrative   Moved from [place], husband is in Museum/gallery conservator.     Family History:  The patient's family history includes Breast cancer in her paternal grandmother; Heart attack in her father and mother; Hypertension in her father and mother.   ROS:   Please see the history of present illness.    Review of Systems  Constitution: Negative.  HENT: Negative.   Eyes: Negative.   Cardiovascular: Negative.   Respiratory: Negative.   Hematologic/Lymphatic: Bruises/bleeds easily.  Musculoskeletal: Negative.  Negative for joint pain.  Gastrointestinal: Negative.   Genitourinary: Negative.   Neurological: Positive for dizziness.   All other systems reviewed and are negative.   PHYSICAL EXAM:   VS:  BP 118/72   Pulse (!) 53   Ht 5\' 4"  (1.626 m)   Wt 137 lb (62.1 kg)   SpO2 95%   BMI 23.52 kg/m   Physical Exam   Affect appropriate Healthy:  appears stated age 88: normal Neck supple with no adenopathy JVP normal no bruits no thyromegaly Lungs clear with no wheezing and good diaphragmatic motion Heart:  S1/S2 SEM through AVR no AR murmur, no rub, gallop or click PMI normal post sternotomy  Abdomen: benighn, BS positve, no tenderness, no AAA no bruit.  No HSM or HJR Distal pulses intact with no bruits No edema Neuro non-focal Skin warm and dry No muscular weakness   Wt Readings from Last 3 Encounters:  07/15/19 137 lb (62.1 kg)  01/15/19 128 lb (58.1 kg)  05/14/18 132  lb (59.9 kg)      Studies/Labs Reviewed:   EKG:  07/15/19 SR rate 53 normal   Recent Labs: No results found for requested labs within last 8760 hours.   Lipid Panel No results found for: CHOL, TRIG, HDL, CHOLHDL, VLDL, LDLCALC, LDLDIRECT  Additional studies/ records that were reviewed today include:   Echo 06/24/19 see HPI  Cardiac catheterization 4/2018There is moderate (3+) aortic regurgitation.   1. Widely patent coronary arteries 2. Bicuspid aortic valve with fluoroscopic evidence of heavy calcification and restricted leaflet mobility, known severe aortic stenosis by noninvasive assessment, and 3+ aortic insufficiency by aortic root angiography 3. Normal right heart hemodynamics      ASSESSMENT:    AVR  PLAN:  In order of problems listed above:  1. AVR Status post Bental with tissue valve and ascending aortic graft 03/21/2017.  Did not require CABG as she had normal coronary arteries.  Echo 06/24/19 no AR stable mean gradient 12 mmHg SBE prophylaxis   2. PAF CHA2DS2-VASc score equals 1 rare palpitations requiring metoprolol  3. HLD:  LDL 137 started on Lipitor 10 mg 07/10/19 f/u labs in 3 months   4. Health Maint:  Ok to proceed with colonoscopy no SBE needed       Medication Adjustments/Labs and Tests Ordered: Current medicines are reviewed at length with the patient today.  Concerns regarding medicines are outlined above.  Medication changes, Labs and Tests ordered today are listed in the Patient Instructions below. Patient Instructions  Medication Instructions:   *If you need a refill on your cardiac medications before your next appointment, please call your pharmacy*  Lab Work:  If you have labs (blood work) drawn today and your tests are completely normal, you will receive your results only by: Marland Kitchen MyChart Message (if you have MyChart) OR . A paper copy in the mail If you have any lab test that is abnormal or we need to change your treatment, we will  call you to review the results.  Testing/Procedures: None ordered today.  Follow-Up: At [facility], you and your health needs are our priority.  As part of our continuing mission to provide you with exceptional heart care, we have created designated Provider Care Teams.  These Care Teams include your primary Cardiologist (physician) and Advanced Practice Providers (APPs -  Physician Assistants and Nurse Practitioners) who all work together to provide you with the care you need, when you need it.  Your next appointment:   6 months  The format for your next appointment:   In Person  Provider:   You may see Jenkins Rouge, MD or one of the following Advanced Practice Providers on your designated Care Team:    Truitt Merle, NP  Cecilie Kicks, NP  Kathyrn Drown, NP     Signed, Jenkins Rouge, MD  07/15/2019 3:02 PM    [facility], [place], [place]  [address] Phone: [phone]; Fax: [phone]

## 2019-07-10 NOTE — Telephone Encounter (Signed)
-----   Message from Josue Hector, MD sent at 07/08/2019  4:17 PM EST ----- LDL was 137 which is high for someone with tissue AV should start lipitor 5 mg daily and repeat labs in 6 months

## 2019-07-14 ENCOUNTER — Other Ambulatory Visit: Payer: Self-pay | Admitting: Internal Medicine

## 2019-07-14 DIAGNOSIS — Z1231 Encounter for screening mammogram for malignant neoplasm of breast: Secondary | ICD-10-CM

## 2019-07-15 ENCOUNTER — Other Ambulatory Visit: Payer: Self-pay

## 2019-07-15 ENCOUNTER — Encounter: Payer: Self-pay | Admitting: Cardiovascular Disease

## 2019-07-15 ENCOUNTER — Ambulatory Visit (INDEPENDENT_AMBULATORY_CARE_PROVIDER_SITE_OTHER): Payer: BC Managed Care – PPO | Admitting: Cardiovascular Disease

## 2019-07-15 VITALS — BP 118/72 | HR 53 | Ht 64.0 in | Wt 137.0 lb

## 2019-07-15 DIAGNOSIS — I480 Paroxysmal atrial fibrillation: Secondary | ICD-10-CM

## 2019-07-15 DIAGNOSIS — Z952 Presence of prosthetic heart valve: Secondary | ICD-10-CM

## 2019-07-15 DIAGNOSIS — E785 Hyperlipidemia, unspecified: Secondary | ICD-10-CM | POA: Diagnosis not present

## 2019-07-15 NOTE — Patient Instructions (Addendum)
Medication Instructions:   *If you need a refill on your cardiac medications before your next appointment, please call your pharmacy*  Lab Work:  If you have labs (blood work) drawn today and your tests are completely normal, you will receive your results only by: . MyChart Message (if you have MyChart) OR . A paper copy in the mail If you have any lab test that is abnormal or we need to change your treatment, we will call you to review the results.  Testing/Procedures: None ordered today.   Follow-Up: At [facility], you and your health needs are our priority.  As part of our continuing mission to provide you with exceptional heart care, we have created designated Provider Care Teams.  These Care Teams include your primary Cardiologist (physician) and Advanced Practice Providers (APPs -  Physician Assistants and Nurse Practitioners) who all work together to provide you with the care you need, when you need it.  Your next appointment:   12 months  The format for your next appointment:   In Person  Provider:   You may see Peter Nishan, MD or one of the following Advanced Practice Providers on your designated Care Team:    Lori Gerhardt, NP  Sydna Ingold, NP  Jill McDaniel, NP    

## 2019-08-04 ENCOUNTER — Telehealth: Payer: Self-pay | Admitting: Cardiovascular Disease

## 2019-08-04 NOTE — Telephone Encounter (Signed)
Per Pre Op team I called [facility] in regards to colonoscopy to be done. Pt is has a New Pt appt tomorrow with Dr. Henrene Pastor with [facility]. I asked if once pt has been seen to please fax over a clearance request to our office fax [phone].

## 2019-08-04 NOTE — Telephone Encounter (Signed)
New Message:     Pt said a clearance had been to Jessica [name] for her. She said she now need you to send oit [name] on [address] through Epic please. She is now going to have her procedure there instead of [facility].

## 2019-08-04 NOTE — Telephone Encounter (Signed)
Pt is cleared for colonoscopy  With no SBE needed per office visit with Dr. Johnsie Cancel on 07/15/2019.   I do not see a request for clearance yet. I do not see a colonoscopy procedure scheduled.   Pre-op callback, can you see if [facility] needs clearance sent to them.

## 2019-08-05 ENCOUNTER — Encounter: Payer: Self-pay | Admitting: Internal Medicine

## 2019-08-05 ENCOUNTER — Ambulatory Visit (INDEPENDENT_AMBULATORY_CARE_PROVIDER_SITE_OTHER): Payer: BC Managed Care – PPO | Admitting: Internal Medicine

## 2019-08-05 ENCOUNTER — Other Ambulatory Visit: Payer: Self-pay

## 2019-08-05 VITALS — BP 106/70 | HR 57 | Temp 98.5°F | Ht 64.0 in | Wt 131.0 lb

## 2019-08-05 DIAGNOSIS — K580 Irritable bowel syndrome with diarrhea: Secondary | ICD-10-CM | POA: Diagnosis not present

## 2019-08-05 DIAGNOSIS — Z1159 Encounter for screening for other viral diseases: Secondary | ICD-10-CM | POA: Diagnosis not present

## 2019-08-05 DIAGNOSIS — Z1211 Encounter for screening for malignant neoplasm of colon: Secondary | ICD-10-CM

## 2019-08-05 DIAGNOSIS — Z9889 Other specified postprocedural states: Secondary | ICD-10-CM | POA: Diagnosis not present

## 2019-08-05 MED ORDER — NA SULFATE-K SULFATE-MG SULF 17.5-3.13-1.6 GM/177ML PO SOLN: 1.0000 | Freq: Once | ORAL | 0 refills | Status: AC

## 2019-08-05 NOTE — Progress Notes (Signed)
HISTORY OF PRESENT ILLNESS:  Jessica [name] is a 58 y.o. 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  Patient's past medical history is remarkable for congenital aortic valve disease for which she is now status post Bentall procedure with Dr. Cyndia Bent July 2018.  The patient has done well since.  She has not had prior screening colonoscopy.  No family history of colon cancer.  She denies change in bowel habits or bleeding.  Review of outside records from her primary provider shows unremarkable laboratories including normal hemoglobin of 13.8.  She does report intermittent problems with postprandial urgency and loose stools.  She has had this for many years.  Unchanged.  REVIEW OF SYSTEMS:  All non-GI ROS negative unless otherwise stated in the HPI except for heart murmur and back pain  Past Medical History:  Diagnosis Date  . Anxiety   . Aortic stenosis   . Bicuspid aortic valve    Previously seen in [place]; moderate AS and mild AR  . Chronic neck pain   . Congenital heart valve abnormality   . Dyspnea    with exertion  . Heart murmur   . Hyperlipidemia   . Paroxysmal atrial fibrillation (HCC)   . PONV (postoperative nausea and vomiting)   . Vertigo     Past Surgical History:  Procedure Laterality Date  . abscess excision     Right thigh  . BENTALL PROCEDURE N/A 03/21/2017   Procedure: BENTALL PROCEDURE;  Surgeon: Gaye Pollack, MD;  Location: [facility] OR;  Service: Open Heart Surgery;  Laterality: N/A;  CIRC ARREST  LEFT RADIAL A-LINE  . ENDOMETRIAL ABLATION    . HERNIA REPAIR    . KNEE SURGERY     Multiple  . LAPAROSCOPY     x2  . MEDIASTINAL EXPLORATION N/A 03/21/2017   Procedure: MEDIASTINAL EXPLORATION FOR BLEEDING;  Surgeon: Gaye Pollack, MD;  Location: [place];  Service: Thoracic;  Laterality: N/A;  . TEE WITHOUT CARDIOVERSION N/A 03/21/2017   Procedure:  TRANSESOPHAGEAL ECHOCARDIOGRAM (TEE);  Surgeon: Gaye Pollack, MD;  Location: [place];  Service: Open Heart Surgery;  Laterality: N/A;  . TUBAL LIGATION    . VENTRICULAR ANEURYSM RESECTION N/A 03/21/2017   Procedure: RESECTION OF ASCENDING ANEURYSM;  Surgeon: Gaye Pollack, MD;  Location: [place];  Service: Open Heart Surgery;  Laterality: N/A;    Social History Jessica [name]  reports that she quit smoking about 9 years ago. She has never used smokeless tobacco. She reports current alcohol use. She reports that she does not use drugs.  family history includes Breast cancer in her paternal grandmother; Colon polyps in her maternal grandmother and mother; Heart attack in her father and mother; Hypertension in her father and mother; Irritable bowel syndrome in her daughter, mother, and sister; Other in her niece.  Allergies  Allergen Reactions  . Penicillins Hives and Other (See Comments)    Has patient had a PCN reaction causing immediate rash, facial/tongue/throat swelling, SOB or lightheadedness with hypotension: unknown Has patient had a PCN reaction causing severe rash involving mucus membranes or skin necrosis:unknown Has patient had a PCN reaction that required hospitalization no Has patient had a PCN reaction occurring within the last 10 years: no If all of the above answers are "NO", then may proceed with Cephalosporin use.  REACTION: hives       PHYSICAL EXAMINATION: Vital signs: BP 106/70  Pulse (!) 57   Temp 98.5 F (36.9 C)   Ht 5\' 4"  (1.626 m)   Wt 131 lb (59.4 kg)   BMI 22.49 kg/m   Constitutional: generally well-appearing, no acute distress Psychiatric: alert and oriented x3, cooperative Eyes: extraocular movements intact, anicteric, conjunctiva pink Mouth: oral pharynx moist, no lesions Neck: supple no lymphadenopathy Cardiovascular: heart regular rate and rhythm, systolic ejection murmur Lungs: clear to auscultation bilaterally Abdomen: soft, nontender,  nondistended, no obvious ascites, no peritoneal signs, normal bowel sounds, no organomegaly Rectal: Deferred to colonoscopy Extremities: no clubbing, cyanosis, or lower extremity edema bilaterally Skin: no lesions on visible extremities Neuro: No focal deficits.  Cranial nerves intact  ASSESSMENT:  1.  Colon cancer screening.  Baseline risk.  Appropriate candidate without contraindication 2.  Diarrhea predominant irritable bowel syndrome.  Stable.  Chronic 3.  History of Bentall procedure.  Doing well   PLAN:  1.  High-fiber diet 2.  Schedule screening colonoscopy.  Can do biopsies at the same time to rule out microscopic colitis.The nature of the procedure, as well as the risks, benefits, and alternatives were carefully and thoroughly reviewed with the patient. Ample time for discussion and questions allowed. The patient understood, was satisfied, and agreed to proceed. 3.  No indication for prophylactic antibiotics based on current ASGE guidelines

## 2019-08-05 NOTE — Patient Instructions (Signed)
You have been scheduled for a colonoscopy. Please follow written instructions given to you at your visit today.  Please pick up your prep supplies at the pharmacy within the next 1-3 days. If you use inhalers (even only as needed), please bring them with you on the day of your procedure.   

## 2019-08-06 NOTE — Telephone Encounter (Signed)
Pt had her appt with GI 12/2. We still have not received a clearance form. Looks like pt is to have a colonoscopy.

## 2019-08-07 ENCOUNTER — Telehealth: Payer: Self-pay | Admitting: Internal Medicine

## 2019-08-07 NOTE — Telephone Encounter (Signed)
Called and spoke with office staff to send pre-op form to our office for clearance 12/04  Kathyrn Drown NP-C [place] Pager: [phone]

## 2019-08-07 NOTE — Telephone Encounter (Signed)
Jessica [name] I think you were working on this for her procedure.

## 2019-08-07 NOTE — Telephone Encounter (Signed)
If cardiology is calling us (not sure why), then, sure, let them give clearance... thanks

## 2019-08-07 NOTE — Telephone Encounter (Signed)
Dr. Henrene Pastor did not instruct me to contact cardiology.  She's not on a blood thinner - I looked at his note to make sure but he doesn't mention getting any kind of clearance.

## 2019-08-07 NOTE — Telephone Encounter (Signed)
Dr. Henrene Pastor does this pt need cardiac clearance for her colonoscopy? Please advise.

## 2019-08-07 NOTE — Telephone Encounter (Signed)
Jessica [name], [place] at cardiology office called requesting form for cardiac clearance for pt. Pls fax form to [phone].

## 2019-08-10 NOTE — Telephone Encounter (Signed)
Magda Paganini can you please send this letter for me to cardiology?

## 2019-08-11 NOTE — Telephone Encounter (Signed)
This patient has been cleared- clearance sent to GI.  Kerin Ransom PA-C 08/11/2019 1:35 PM

## 2019-08-11 NOTE — Telephone Encounter (Signed)
   Primary Cardiologist: Jenkins Rouge, MD  Chart reviewed as part of pre-operative protocol coverage. Given past medical history and time since last visit, based on ACC/AHA guidelines, SAMADHI GODING would be at acceptable risk for the planned procedure without further cardiovascular testing.   Per Dr Johnsie Cancel- no SBE required.  I will route this recommendation to the requesting party via Epic fax function and remove from pre-op pool.  Please call with questions.  Kerin Ransom, PA-C 08/11/2019, 9:48 AM

## 2019-08-11 NOTE — Telephone Encounter (Signed)
I asked my CMA to check on clearance request.  lk

## 2019-08-11 NOTE — Telephone Encounter (Signed)
[facility] Pre-operative Risk Assessment     Request for surgical clearance:     Endoscopy Procedure  What type of surgery is being performed?  Colonoscopy When is this surgery scheduled?     08/21/2019  What type of clearance is required ?   Pharmacy  Are there any medications that need to be held prior to surgery and how long? Not on a blood thinner.  Just general cardiac clearance  Practice name and name of physician performing surgery?      [facility]  What is your office phone and fax number?      Phone- [phone]  [phone]  Anesthesia type (None, local, MAC, general) ?       MAC

## 2019-08-19 ENCOUNTER — Encounter: Payer: Self-pay | Admitting: Internal Medicine

## 2019-08-19 ENCOUNTER — Other Ambulatory Visit: Payer: Self-pay | Admitting: Internal Medicine

## 2019-08-19 ENCOUNTER — Ambulatory Visit (INDEPENDENT_AMBULATORY_CARE_PROVIDER_SITE_OTHER): Payer: BC Managed Care – PPO

## 2019-08-19 DIAGNOSIS — Z1159 Encounter for screening for other viral diseases: Secondary | ICD-10-CM

## 2019-08-20 LAB — SARS CORONAVIRUS 2 (TAT 6-24 HRS): SARS Coronavirus 2: NEGATIVE

## 2019-08-21 ENCOUNTER — Other Ambulatory Visit: Payer: Self-pay

## 2019-08-21 ENCOUNTER — Ambulatory Visit (AMBULATORY_SURGERY_CENTER): Payer: BC Managed Care – PPO | Admitting: Internal Medicine

## 2019-08-21 ENCOUNTER — Encounter: Payer: Self-pay | Admitting: Internal Medicine

## 2019-08-21 VITALS — BP 102/68 | HR 48 | Temp 99.1°F | Resp 15 | Ht 64.0 in | Wt 131.0 lb

## 2019-08-21 DIAGNOSIS — D122 Benign neoplasm of ascending colon: Secondary | ICD-10-CM

## 2019-08-21 DIAGNOSIS — D120 Benign neoplasm of cecum: Secondary | ICD-10-CM

## 2019-08-21 DIAGNOSIS — Z1211 Encounter for screening for malignant neoplasm of colon: Secondary | ICD-10-CM | POA: Diagnosis not present

## 2019-08-21 MED ORDER — SODIUM CHLORIDE 0.9 % IV SOLN: 500.0000 mL | Freq: Once | INTRAVENOUS | Status: DC

## 2019-08-21 NOTE — Op Note (Signed)
[facility] Patient Name: Jessica [name] Procedure Date: 08/21/2019 2:33 PM MRN: [id number] Endoscopist: Docia Chuck. Henrene Pastor , MD Age: 58 Referring MD:  Date of Birth: 1961/01/08 Gender: Female Account #: [id number] Procedure:                Colonoscopy with cold snare polypectomy x 2 Indications:              Screening for colorectal malignant neoplasm Medicines:                Monitored Anesthesia Care Procedure:                Pre-Anesthesia Assessment:                           - Prior to the procedure, a History and Physical                            was performed, and patient medications and                            allergies were reviewed. The patient's tolerance of                            previous anesthesia was also reviewed. The risks                            and benefits of the procedure and the sedation                            options and risks were discussed with the patient.                            All questions were answered, and informed consent                            was obtained. Prior Anticoagulants: The patient has                            taken no previous anticoagulant or antiplatelet                            agents. ASA Grade Assessment: II - A patient with                            mild systemic disease. After reviewing the risks                            and benefits, the patient was deemed in                            satisfactory condition to undergo the procedure.                           After obtaining informed consent, the colonoscope  was passed under direct vision. Throughout the                            procedure, the patient's blood pressure, pulse, and                            oxygen saturations were monitored continuously. The                            Colonoscope was introduced through the anus and                            advanced to the the cecum, identified by        appendiceal orifice and ileocecal valve. The                            ileocecal valve, appendiceal orifice, and rectum                            were photographed. The quality of the bowel                            preparation was excellent. The colonoscopy was                            performed without difficulty. The patient tolerated                            the procedure well. The bowel preparation used was                            SUPREP via split dose instruction. Scope In: 2:39:57 PM Scope Out: 2:55:55 PM Scope Withdrawal Time: 0 hours 10 minutes 27 seconds  Total Procedure Duration: 0 hours 15 minutes 58 seconds  Findings:                 Two polyps were found in the ascending colon and                            cecum. The polyps were 2 to 3 mm in size. These                            polyps were removed with a cold snare. Resection                            and retrieval were complete.                           Internal hemorrhoids were found during retroflexion.                           The exam was otherwise without abnormality on  direct and retroflexion views. Complications:            No immediate complications. Estimated blood loss:                            None. Estimated Blood Loss:     Estimated blood loss: none. Impression:               - Two 2 to 3 mm polyps in the ascending colon and                            in the cecum, removed with a cold snare. Resected                            and retrieved.                           - Internal hemorrhoids.                           - The examination was otherwise normal on direct                            and retroflexion views. Recommendation:           - Repeat colonoscopy in 7 years for surveillance.                           - Patient has a contact number available for                            emergencies. The signs and symptoms of potential                             delayed complications were discussed with the                            patient. Return to normal activities tomorrow.                            Written discharge instructions were provided to the                            patient.                           - Resume previous diet.                           - Continue present medications.                           - Await pathology results. Docia Chuck. Henrene Pastor, MD 08/21/2019 3:00:30 PM This report has been signed electronically.

## 2019-08-21 NOTE — Patient Instructions (Signed)
Information on polyps and hemorrhoids given to you today.  Await pathology results.  Repeat colonoscopy in 7 years.  YOU HAD AN ENDOSCOPIC PROCEDURE TODAY AT [facility]:   Refer to the procedure report that was given to you for any specific questions about what was found during the examination.  If the procedure report does not answer your questions, please call your gastroenterologist to clarify.  If you requested that your care partner not be given the details of your procedure findings, then the procedure report has been included in a sealed envelope for you to review at your convenience later.  YOU SHOULD EXPECT: Some feelings of bloating in the abdomen. Passage of more gas than usual.  Walking can help get rid of the air that was put into your GI tract during the procedure and reduce the bloating. If you had a lower endoscopy (such as a colonoscopy or flexible sigmoidoscopy) you may notice spotting of blood in your stool or on the toilet paper. If you underwent a bowel prep for your procedure, you may not have a normal bowel movement for a few days.  Please Note:  You might notice some irritation and congestion in your nose or some drainage.  This is from the oxygen used during your procedure.  There is no need for concern and it should clear up in a day or so.  SYMPTOMS TO REPORT IMMEDIATELY:   Following lower endoscopy (colonoscopy or flexible sigmoidoscopy):  Excessive amounts of blood in the stool  Significant tenderness or worsening of abdominal pains  Swelling of the abdomen that is new, acute  Fever of 100F or higher  For urgent or emergent issues, a gastroenterologist can be reached at any hour by calling [phone].   DIET:  We do recommend a small meal at first, but then you may proceed to your regular diet.  Drink plenty of fluids but you should avoid alcoholic beverages for 24 hours.  ACTIVITY:  You should plan to take it easy for the rest of today  and you should NOT DRIVE or use heavy machinery until tomorrow (because of the sedation medicines used during the test).    FOLLOW UP: Our staff will call the number listed on your records 48-72 hours following your procedure to check on you and address any questions or concerns that you may have regarding the information given to you following your procedure. If we do not reach you, we will leave a message.  We will attempt to reach you two times.  During this call, we will ask if you have developed any symptoms of COVID 19. If you develop any symptoms (ie: fever, flu-like symptoms, shortness of breath, cough etc.) before then, please call [phone].  If you test positive for Covid 19 in the 2 weeks post procedure, please call and report this information to [place].    If any biopsies were taken you will be contacted by phone or by letter within the next 1-3 weeks.  Please call us at [phone] if you have not heard about the biopsies in 3 weeks.    SIGNATURES/CONFIDENTIALITY: You and/or your care partner have signed paperwork which will be entered into your electronic medical record.  These signatures attest to the fact that that the information above on your After Visit Summary has been reviewed and is understood.  Full responsibility of the confidentiality of this discharge information lies with you and/or your care-partner.

## 2019-08-21 NOTE — Progress Notes (Signed)
Pt's states no medical or surgical changes since previsit or office visit.  Temp LC VS  Dt

## 2019-08-21 NOTE — Progress Notes (Signed)
A and O x3. Report to RN. Tolerated MAC anesthesia well.

## 2019-08-21 NOTE — Progress Notes (Signed)
Called to room to assist during endoscopic procedure.  Patient ID and intended procedure confirmed with present staff. Received instructions for my participation in the procedure from the performing physician.  

## 2019-08-25 ENCOUNTER — Telehealth: Payer: Self-pay

## 2019-08-25 NOTE — Telephone Encounter (Signed)
  Follow up Call-  Call back number 08/21/2019  Post procedure Call Back phone  # [phone]  Permission to leave phone message Yes  Some recent data might be hidden     Patient questions:  Do you have a fever, pain , or abdominal swelling? No. Pain Score  0 *  Have you tolerated food without any problems? Yes.    Have you been able to return to your normal activities? Yes.    Do you have any questions about your discharge instructions: Diet   No. Medications  No. Follow up visit  No.  Do you have questions or concerns about your Care? No.  Actions: * If pain score is 4 or above: No action needed, pain <4.   1. Have you developed a fever since your procedure? no  2.   Have you had an respiratory symptoms (SOB or cough) since your procedure? no  3.   Have you tested positive for COVID 19 since your procedure no  4.   Have you had any family members/close contacts diagnosed with the COVID 19 since your procedure?  no   If yes to any of these questions please route to Joylene John, RN and Alphonsa Gin, Therapist, sports.

## 2019-08-27 ENCOUNTER — Encounter: Payer: Self-pay | Admitting: Internal Medicine

## 2019-09-07 ENCOUNTER — Ambulatory Visit: Payer: BC Managed Care – PPO

## 2019-09-14 ENCOUNTER — Telehealth: Payer: Self-pay | Admitting: Cardiovascular Disease

## 2019-09-14 NOTE — Telephone Encounter (Signed)
We are recommending the COVID-19 vaccine to all of our patients. Cardiac medications (including blood thinners) should not deter anyone from being vaccinated and there is no need to hold any of those medications prior to vaccine administration.     Currently, there is a hotline to call (active 09/11/19) to schedule vaccination appointments as no walk-ins will be accepted.   Number: [phone]    If you have further questions or concerns about the vaccine process, please visit [email or web address] or contact your primary care physician.

## 2019-09-30 ENCOUNTER — Ambulatory Visit: Payer: BC Managed Care – PPO

## 2019-10-10 ENCOUNTER — Ambulatory Visit: Payer: BC Managed Care – PPO

## 2019-10-15 ENCOUNTER — Ambulatory Visit
Admission: RE | Admit: 2019-10-15 | Discharge: 2019-10-15 | Disposition: A | Discharge status: Home / Self Care | Payer: BC Managed Care – PPO | Source: Ambulatory Visit | Attending: Internal Medicine | Admitting: Internal Medicine

## 2019-10-15 ENCOUNTER — Other Ambulatory Visit: Payer: Self-pay

## 2019-10-15 DIAGNOSIS — Z1231 Encounter for screening mammogram for malignant neoplasm of breast: Secondary | ICD-10-CM

## 2019-10-19 ENCOUNTER — Other Ambulatory Visit: Payer: Self-pay | Admitting: Internal Medicine

## 2019-10-19 DIAGNOSIS — R928 Other abnormal and inconclusive findings on diagnostic imaging of breast: Secondary | ICD-10-CM

## 2019-10-20 ENCOUNTER — Ambulatory Visit
Admission: RE | Admit: 2019-10-20 | Discharge: 2019-10-20 | Disposition: A | Discharge status: Home / Self Care | Payer: BC Managed Care – PPO | Source: Ambulatory Visit | Attending: Internal Medicine | Admitting: Internal Medicine

## 2019-10-20 ENCOUNTER — Ambulatory Visit: Payer: BC Managed Care – PPO

## 2019-10-20 ENCOUNTER — Other Ambulatory Visit: Payer: Self-pay

## 2019-10-20 DIAGNOSIS — R922 Inconclusive mammogram: Secondary | ICD-10-CM | POA: Diagnosis not present

## 2019-10-20 DIAGNOSIS — R928 Other abnormal and inconclusive findings on diagnostic imaging of breast: Secondary | ICD-10-CM

## 2019-10-21 ENCOUNTER — Ambulatory Visit: Payer: BC Managed Care – PPO

## 2020-03-11 DIAGNOSIS — F410 Panic disorder [episodic paroxysmal anxiety] without agoraphobia: Secondary | ICD-10-CM | POA: Diagnosis not present

## 2020-03-17 DIAGNOSIS — F410 Panic disorder [episodic paroxysmal anxiety] without agoraphobia: Secondary | ICD-10-CM | POA: Diagnosis not present

## 2020-03-28 DIAGNOSIS — F410 Panic disorder [episodic paroxysmal anxiety] without agoraphobia: Secondary | ICD-10-CM | POA: Diagnosis not present

## 2020-04-13 DIAGNOSIS — F410 Panic disorder [episodic paroxysmal anxiety] without agoraphobia: Secondary | ICD-10-CM | POA: Diagnosis not present

## 2020-07-26 DIAGNOSIS — F410 Panic disorder [episodic paroxysmal anxiety] without agoraphobia: Secondary | ICD-10-CM | POA: Diagnosis not present

## 2020-08-01 ENCOUNTER — Other Ambulatory Visit: Payer: Self-pay

## 2020-08-01 ENCOUNTER — Encounter: Payer: Self-pay | Admitting: Cardiovascular Disease

## 2020-08-01 ENCOUNTER — Telehealth (INDEPENDENT_AMBULATORY_CARE_PROVIDER_SITE_OTHER): Payer: BC Managed Care – PPO | Admitting: Cardiovascular Disease

## 2020-08-01 VITALS — Ht 64.0 in | Wt 128.0 lb

## 2020-08-01 DIAGNOSIS — I480 Paroxysmal atrial fibrillation: Secondary | ICD-10-CM

## 2020-08-01 DIAGNOSIS — Z952 Presence of prosthetic heart valve: Secondary | ICD-10-CM

## 2020-08-01 DIAGNOSIS — F419 Anxiety disorder, unspecified: Secondary | ICD-10-CM | POA: Diagnosis not present

## 2020-08-01 MED ORDER — ATORVASTATIN CALCIUM 10 MG PO TABS: 5.0000 mg | ORAL_TABLET | Freq: Every day | ORAL | 3 refills | Status: DC

## 2020-08-01 NOTE — Progress Notes (Signed)
Cardiology Office Note    Date:  08/01/2020   ID:  Jessica [name], DOB 05-Mar-1961, MRN [id number]  PCP:  Deland Pretty, MD  Cardiologist: Jenkins Rouge, MD EPS: None  No chief complaint on file.   History of Present Illness:  Jessica [name] is a 59 y.o. female with history of bicuspid aortic valve disease status post Bentall procedure with pericardial valve 26 mm Gelweave Valsalva graft for a sending aneurysm 03/21/2017 with Dr. Cyndia Bent.  Left heart cath showed normal coronary arteries and normal LVEF 60 to 65%.  She did have PAF self limited with CHADVASC 1 so no long term anticoagulation  .  On statin for HLD   Echo reviewed 06/24/19  EF 55% Mild LAE  AVR with mean gradient 12 mmHg peak 24 mmHg DVI normal 0.48  no AR  Husband works for [employer] and normally travels all over world  Having more issues with anxiety   Had colonoscopy 08/21/19 no SBE needed   Family and herself vaccinated/boosted   Oldest daughter and grand daughter having issues Contributes to her anxiety  Panic attacks while driving Has been on Zoloft in past  Has two girls and a boy latter in grad school at [employer] Wanted to go to [employer] in [place]    Past Medical History:  Diagnosis Date  . Anxiety   . Aortic stenosis   . Bicuspid aortic valve    Previously seen in [place]; moderate AS and mild AR  . Chronic neck pain   . Congenital heart valve abnormality   . Dyspnea    with exertion  . Heart murmur   . Hyperlipidemia   . Paroxysmal atrial fibrillation (HCC)   . PONV (postoperative nausea and vomiting)   . Vertigo     Past Surgical History:  Procedure Laterality Date  . abscess excision     Right thigh  . BENTALL PROCEDURE N/A 03/21/2017   Procedure: BENTALL PROCEDURE;  Surgeon: Gaye Pollack, MD;  [facility] OR;  Service: Open Heart Surgery;  Laterality: N/A;  CIRC ARREST  LEFT RADIAL A-LINE  . ENDOMETRIAL ABLATION    . HERNIA REPAIR    .  KNEE SURGERY     Multiple  . LAPAROSCOPY     x2  . MEDIASTINAL EXPLORATION N/A 03/21/2017   Procedure: MEDIASTINAL EXPLORATION FOR BLEEDING;  Surgeon: Gaye Pollack, MD;  Location: [place];  Service: Thoracic;  Laterality: N/A;  . TEE WITHOUT CARDIOVERSION N/A 03/21/2017   Procedure: TRANSESOPHAGEAL ECHOCARDIOGRAM (TEE);  Surgeon: Gaye Pollack, MD;  Location: [place];  Service: Open Heart Surgery;  Laterality: N/A;  . TUBAL LIGATION    . VENTRICULAR ANEURYSM RESECTION N/A 03/21/2017   Procedure: RESECTION OF ASCENDING ANEURYSM;  Surgeon: Gaye Pollack, MD;  Location: [place];  Service: Open Heart Surgery;  Laterality: N/A;    Current Medications: Current Meds  Medication Sig  . aspirin EC 81 MG tablet Take 81 mg by mouth daily.  Marland Kitchen atorvastatin (LIPITOR) 10 MG tablet Take 0.5 tablets (5 mg total) by mouth daily at 6 PM.  . metoprolol tartrate (LOPRESSOR) 25 MG tablet TAKE 1 TABLET (25 MG TOTAL) BY MOUTH AS NEEDED (PALPITATIONS).  . Multiple Vitamin (MULTIVITAMIN WITH MINERALS) TABS tablet Take 1 tablet by mouth daily.  Marland Kitchen VITAMIN D PO Take 1 capsule by mouth daily.     Allergies:   Penicillins   Social History   Socioeconomic History  . Marital status: Married  Spouse name: Not on file  . Number of children: 3  . Years of education: Not on file  . Highest education level: Not on file  Occupational History  . Occupation: Scientist, forensic  Tobacco Use  . Smoking status: Former Smoker    Quit date: 09/03/2009    Years since quitting: 10.9  . Smokeless tobacco: Never Used  . Tobacco comment: Quit using Chantix.  Vaping Use  . Vaping Use: Former  Substance and Sexual Activity  . Alcohol use: Yes    Comment: daily 2-3 glasses of wine  . Drug use: No  . Sexual activity: Not on file  Other Topics Concern  . Not on file  Social History Narrative   Moved from [place], husband is in Museum/gallery conservator.   Social Determinants of Health   Financial Resource Strain:   . Difficulty of  Paying Living Expenses: Not on file  Food Insecurity:   . Worried About Charity fundraiser in the Last Year: Not on file  . Ran Out of Food in the Last Year: Not on file  Transportation Needs:   . Lack of Transportation (Medical): Not on file  . Lack of Transportation (Non-Medical): Not on file  Physical Activity:   . Days of Exercise per Week: Not on file  . Minutes of Exercise per Session: Not on file  Stress:   . Feeling of Stress : Not on file  Social Connections:   . Frequency of Communication with Friends and Family: Not on file  . Frequency of Social Gatherings with Friends and Family: Not on file  . Attends Religious Services: Not on file  . Active Member of Clubs or Organizations: Not on file  . Attends Archivist Meetings: Not on file  . Marital Status: Not on file     Family History:  The patient's family history includes Breast cancer in her paternal grandmother; Colon cancer in her maternal grandmother; Colon polyps in her maternal grandmother and mother; Heart attack in her father and mother; Hypertension in her father and mother; Irritable bowel syndrome in her daughter, mother, and sister; Other in her niece.   ROS:   Please see the history of present illness.    Review of Systems  Constitutional: Negative.  HENT: Negative.   Eyes: Negative.   Cardiovascular: Negative.   Respiratory: Negative.   Hematologic/Lymphatic: Bruises/bleeds easily.  Musculoskeletal: Negative.  Negative for joint pain.  Gastrointestinal: Negative.   Genitourinary: Negative.   Neurological: Positive for dizziness.   All other systems reviewed and are negative.   PHYSICAL EXAM:   VS:  Ht 5\' 4"  (1.626 m)   Wt 58.1 kg   BMI 21.97 kg/m   Physical Exam   Telephone no exam   Wt Readings from Last 3 Encounters:  08/01/20 58.1 kg  08/21/19 59.4 kg  08/05/19 59.4 kg      Studies/Labs Reviewed:   EKG:  07/15/19 SR rate 53 normal   Recent Labs: No results found for  requested labs within last 8760 hours.   Lipid Panel No results found for: CHOL, TRIG, HDL, CHOLHDL, VLDL, LDLCALC, LDLDIRECT  Additional studies/ records that were reviewed today include:   Echo 06/24/19 see HPI   Cardiac catheterization 4/2018There is moderate (3+) aortic regurgitation.   1. Widely patent coronary arteries 2. Bicuspid aortic valve with fluoroscopic evidence of heavy calcification and restricted leaflet mobility, known severe aortic stenosis by noninvasive assessment, and 3+ aortic insufficiency by aortic root angiography 3. Normal  right heart hemodynamics      ASSESSMENT:    AVR  PLAN:  In order of problems listed above:  1. AVR Status post Bental with tissue valve and ascending aortic graft 03/21/2017.  Did not require CABG as she had normal coronary arteries.  Echo 06/24/19 no AR stable mean gradient 12 mmHg SBE prophylaxis not needed for colonoscopy No need for update on echo currently   2. PAF CHA2DS2-VASc score equals 1 no recurrence using lopressor for more rapid HR with anxiety   3. HLD:  LDL 137 started on Lipitor 10 mg 07/10/19 f/u labs in 3 months Discussed compliance and fact its ok to take in am if she is forgetting at night   4. Anxiety:  Worse reactive related to daughter and grand daughter issues Consider SSRI has f/u appointment with primary today    Time :  Spent reviewing chart, echo direct patient interview and composing note 20 minutes    Medication Adjustments/Labs and Tests Ordered: Current medicines are reviewed at length with the patient today.  Concerns regarding medicines are outlined above.  Medication changes, Labs and Tests ordered today are listed in the Patient Instructions below. There are no Patient Instructions on file for this visit.   Signed, Jenkins Rouge, MD  08/01/2020 8:46 AM    [address], [place], [place]  [address] Phone: [phone]; Fax: [phone]

## 2020-08-01 NOTE — Addendum Note (Signed)
Addended byEarley Favor, Khayla Koppenhaver L on: 08/01/2020 09:00 AM   Modules accepted: Orders

## 2020-08-01 NOTE — Patient Instructions (Signed)
Medication Instructions:  Your physician recommends that you continue on your current medications as directed. Please refer to the Current Medication list given to you today.  *If you need a refill on your cardiac medications before your next appointment, please call your pharmacy*   Lab Work: none If you have labs (blood work) drawn today and your tests are completely normal, you will receive your results only by: Marland Kitchen MyChart Message (if you have MyChart) OR . A paper copy in the mail If you have any lab test that is abnormal or we need to change your treatment, we will call you to review the results.   Testing/Procedures: none   Follow-Up: At [facility], you and your health needs are our priority.  As part of our continuing mission to provide you with exceptional heart care, we have created designated Provider Care Teams.  These Care Teams include your primary Cardiologist (physician) and Advanced Practice Providers (APPs -  Physician Assistants and Nurse Practitioners) who all work together to provide you with the care you need, when you need it.  We recommend signing up for the patient portal called "MyChart".  Sign up information is provided on this After Visit Summary.  MyChart is used to connect with patients for Virtual Visits (Telemedicine).  Patients are able to view lab/test results, encounter notes, upcoming appointments, etc.  Non-urgent messages can be sent to your provider as well.   To learn more about what you can do with MyChart, go to [email or web address].    Your next appointment:   6 month(s)  The format for your next appointment:   In Person  Provider:   You may see Jenkins Rouge, MD or one of the following Advanced Practice Providers on your designated Care Team:    Truitt Merle, NP  Cecilie Kicks, NP  Kathyrn Drown, NP    Other Instructions

## 2020-08-08 DIAGNOSIS — F410 Panic disorder [episodic paroxysmal anxiety] without agoraphobia: Secondary | ICD-10-CM | POA: Diagnosis not present

## 2020-09-07 DIAGNOSIS — E559 Vitamin D deficiency, unspecified: Secondary | ICD-10-CM | POA: Diagnosis not present

## 2020-09-07 DIAGNOSIS — Z0000 Encounter for general adult medical examination without abnormal findings: Secondary | ICD-10-CM | POA: Diagnosis not present

## 2020-09-14 DIAGNOSIS — Z01419 Encounter for gynecological examination (general) (routine) without abnormal findings: Secondary | ICD-10-CM | POA: Diagnosis not present

## 2020-09-14 DIAGNOSIS — Z0000 Encounter for general adult medical examination without abnormal findings: Secondary | ICD-10-CM | POA: Diagnosis not present

## 2021-03-17 DIAGNOSIS — Z0000 Encounter for general adult medical examination without abnormal findings: Secondary | ICD-10-CM | POA: Diagnosis not present

## 2021-03-27 DIAGNOSIS — F411 Generalized anxiety disorder: Secondary | ICD-10-CM | POA: Diagnosis not present

## 2021-04-04 DIAGNOSIS — F411 Generalized anxiety disorder: Secondary | ICD-10-CM | POA: Diagnosis not present

## 2021-04-12 DIAGNOSIS — F411 Generalized anxiety disorder: Secondary | ICD-10-CM | POA: Diagnosis not present

## 2021-04-26 DIAGNOSIS — F411 Generalized anxiety disorder: Secondary | ICD-10-CM | POA: Diagnosis not present

## 2021-06-14 DIAGNOSIS — F411 Generalized anxiety disorder: Secondary | ICD-10-CM | POA: Diagnosis not present

## 2021-07-08 ENCOUNTER — Other Ambulatory Visit: Payer: Self-pay | Admitting: Cardiovascular Disease

## 2021-07-10 ENCOUNTER — Other Ambulatory Visit: Payer: Self-pay

## 2021-07-10 MED ORDER — ATORVASTATIN CALCIUM 10 MG PO TABS: 5.0000 mg | ORAL_TABLET | Freq: Every day | ORAL | 0 refills | Status: DC

## 2021-07-17 ENCOUNTER — Telehealth: Payer: Self-pay | Admitting: Cardiovascular Disease

## 2021-07-17 ENCOUNTER — Other Ambulatory Visit: Payer: Self-pay | Admitting: Internal Medicine

## 2021-07-17 DIAGNOSIS — E785 Hyperlipidemia, unspecified: Secondary | ICD-10-CM

## 2021-07-17 DIAGNOSIS — Z1231 Encounter for screening mammogram for malignant neoplasm of breast: Secondary | ICD-10-CM

## 2021-07-17 NOTE — Telephone Encounter (Signed)
Pt c/o medication issue:  1. Name of Medication: atorvastatin (LIPITOR) 10 MG tablet  2. How are you currently taking this medication (dosage and times per day)? Half a tablet daily  3. Are you having a reaction (difficulty breathing--STAT)? no  4. What is your medication issue? Patient states the office prescribed a low dose of atorvastatin. She says her cholesterol has not changed and is not sure if her labs were sent to office. She would like to discuss whether her medications need to be changed.

## 2021-07-17 NOTE — Telephone Encounter (Signed)
Left message for patient to call back.   Most recent lab work is from 09/07/20.  Total Cholestrerol 243 Triglycerides           74 HDL                       111 LDL                        126

## 2021-07-18 MED ORDER — ATORVASTATIN CALCIUM 10 MG PO TABS: 10.0000 mg | ORAL_TABLET | Freq: Every day | ORAL | 3 refills | Status: DC

## 2021-07-18 NOTE — Telephone Encounter (Signed)
Per Dr. Johnsie Cancel, Can change to crestor 10 mg and f/u labs in 3 months. Called patient back with recommendations. Patient will come in on 10/16/21 for lab work before her follow up with Richardson Dopp PA.

## 2021-07-18 NOTE — Telephone Encounter (Signed)
Patient called back. She stated she had more recent lab work in July and that her results were.  Total Cholesterol   250 Triglycerides           75 HDL                        126 LDL                         109   Patient is currently taking Lipitor 5 mg. Patient is wanting to know if she needs to increase her Lipitor. Patient stated she is tolerating the Lipitor and has no issues with taking it or increasing it if necessary.

## 2021-07-19 DIAGNOSIS — M545 Low back pain, unspecified: Secondary | ICD-10-CM | POA: Diagnosis not present

## 2021-08-18 ENCOUNTER — Ambulatory Visit: Payer: BC Managed Care – PPO

## 2021-09-11 ENCOUNTER — Ambulatory Visit
Admission: RE | Admit: 2021-09-11 | Discharge: 2021-09-11 | Disposition: A | Discharge status: Home / Self Care | Payer: BC Managed Care – PPO | Source: Ambulatory Visit | Attending: Internal Medicine | Admitting: Internal Medicine

## 2021-09-11 DIAGNOSIS — Z1231 Encounter for screening mammogram for malignant neoplasm of breast: Secondary | ICD-10-CM

## 2021-09-14 DIAGNOSIS — E559 Vitamin D deficiency, unspecified: Secondary | ICD-10-CM | POA: Diagnosis not present

## 2021-09-14 DIAGNOSIS — R300 Dysuria: Secondary | ICD-10-CM | POA: Diagnosis not present

## 2021-09-14 DIAGNOSIS — Z0000 Encounter for general adult medical examination without abnormal findings: Secondary | ICD-10-CM | POA: Diagnosis not present

## 2021-09-18 DIAGNOSIS — Z01419 Encounter for gynecological examination (general) (routine) without abnormal findings: Secondary | ICD-10-CM | POA: Diagnosis not present

## 2021-09-18 DIAGNOSIS — Z0000 Encounter for general adult medical examination without abnormal findings: Secondary | ICD-10-CM | POA: Diagnosis not present

## 2021-10-11 ENCOUNTER — Telehealth: Payer: Self-pay | Admitting: Cardiovascular Disease

## 2021-10-11 NOTE — Telephone Encounter (Signed)
Patient called in canceling her lab appt for 02/13 stating she had her labs done with her PCP within the past month. She reports she had these faxed over to the office and would like to know if they were received.

## 2021-10-11 NOTE — Telephone Encounter (Signed)
Called patient back to let her know, we did not receive lab work. She will call her PCP to get it sent over.

## 2021-10-16 ENCOUNTER — Other Ambulatory Visit: Payer: BC Managed Care – PPO

## 2021-10-17 ENCOUNTER — Encounter: Payer: Self-pay | Admitting: Physician Assistant

## 2021-10-17 ENCOUNTER — Ambulatory Visit (INDEPENDENT_AMBULATORY_CARE_PROVIDER_SITE_OTHER): Payer: BC Managed Care – PPO | Admitting: Physician Assistant

## 2021-10-17 ENCOUNTER — Other Ambulatory Visit: Payer: Self-pay

## 2021-10-17 ENCOUNTER — Other Ambulatory Visit: Payer: Self-pay | Admitting: *Deleted

## 2021-10-17 VITALS — BP 100/70 | HR 68 | Ht 64.0 in | Wt 129.0 lb

## 2021-10-17 DIAGNOSIS — E782 Mixed hyperlipidemia: Secondary | ICD-10-CM

## 2021-10-17 DIAGNOSIS — F419 Anxiety disorder, unspecified: Secondary | ICD-10-CM | POA: Diagnosis not present

## 2021-10-17 DIAGNOSIS — Z952 Presence of prosthetic heart valve: Secondary | ICD-10-CM | POA: Diagnosis not present

## 2021-10-17 DIAGNOSIS — E785 Hyperlipidemia, unspecified: Secondary | ICD-10-CM

## 2021-10-17 DIAGNOSIS — I480 Paroxysmal atrial fibrillation: Secondary | ICD-10-CM | POA: Diagnosis not present

## 2021-10-17 MED ORDER — METOPROLOL TARTRATE 25 MG PO TABS: 25.0000 mg | ORAL_TABLET | ORAL | 1 refills | Status: DC | PRN

## 2021-10-17 NOTE — Patient Instructions (Addendum)
Medication Instructions:   Your physician recommends that you continue on your current medications as directed. Please refer to the Current Medication list given to you today.  *If you need a refill on your cardiac medications before your next appointment, please call your pharmacy*   Lab Work:  Your physician recommends that you have FASTING lipid profile/lft in 2 months. Fasting from midnight the night before.    If you have labs (blood work) drawn today and your tests are completely normal, you will receive your results only by: [place] (if you have MyChart) OR A paper copy in the mail If you have any lab test that is abnormal or we need to change your treatment, we will call you to review the results.   Testing/Procedures:  Your physician has requested that you have an echocardiogram. Echocardiography is a painless test that uses sound waves to create images of your heart. It provides your doctor with information about the size and shape of your heart and how well your hearts chambers and valves are working. This procedure takes approximately one hour. There are no restrictions for this procedure. Tuesday, January 16 @ 2:00 PM.   Follow-Up: At [facility], you and your health needs are our priority.  As part of our continuing mission to provide you with exceptional heart care, we have created designated Provider Care Teams.  These Care Teams include your primary Cardiologist (physician) and Advanced Practice Providers (APPs -  Physician Assistants and Nurse Practitioners) who all work together to provide you with the care you need, when you need it.  We recommend signing up for the patient portal called "MyChart".  Sign up information is provided on this After Visit Summary.  MyChart is used to connect with patients for Virtual Visits (Telemedicine).  Patients are able to view lab/test results, encounter notes, upcoming appointments, etc.  Non-urgent messages can be sent to  your provider as well.   To learn more about what you can do with MyChart, go to [email or web address].    Your next appointment:   1 year(s)  The format for your next appointment:   In Person  Provider:   Jenkins Rouge, MD     Other Instructions

## 2021-10-17 NOTE — Progress Notes (Signed)
Office Visit    Patient Name: Jessica [name] Date of Encounter: 10/17/2021  PCP:  Deland Pretty, [facility]  Cardiologist:  Jenkins Rouge, MD  Advanced Practice Provider:  No care team member to display Electrophysiologist:  None   HPI    Jessica [name] is a 61 y.o. female with a hx of bicuspid aortic valve disease status post Bentall procedure with pericardial valve 26 mm Gelweave Valsalva graft for a sending aneurysm on 03/21/2017 with Dr. Tito Dine (see CHA2DS2-VASc score 1), anxiety presents today for follow-up visit.   06/29/2019 LDL was 137.  Started on statin in light of her tissue valve.  Echocardiogram revealed 55% EF with mild LAE, AVR with mean gradient 12 mmHg peak gradient 24 mmHg DVI normal 0.48 no AR.  She was last seen 07/15/2019.  At that time she was exercising a lot, lifting weights, doing yoga.  She was trying to eat clean and had some occasional palpitations.  She uses her metoprolol about every 2 months.  She denied chest pain, dyspnea, dyspnea on exertion, and edema.  Today, she feels pretty good.  She was recently started on some anxiety medicine which has certainly helped.  She states her blood pressure at home is usually 409-811 systolic over 91-47 diastolic.  She occasionally has some dizziness lightheadedness but this is pretty rare.  She continues to be very active and does some weight training as well as a spin bike at home.  She occasionally does some yoga as well.  She has had very rare episodes of palpitations and they usually resolve with her as needed metoprolol.  She usually cannot get through all the metoprolol pills before the medication expires.  She takes them very rarely.  Reports no shortness of breath nor dyspnea on exertion. Reports no chest pain, pressure, or tightness. No edema, orthopnea, PND. Reports no palpitations.    Past Medical History    Past Medical History:  Diagnosis Date   Anxiety     Aortic stenosis    Bicuspid aortic valve    Previously seen in [place]; moderate AS and mild AR   Chronic neck pain    Congenital heart valve abnormality    Dyspnea    with exertion   Heart murmur    Hyperlipidemia    Paroxysmal atrial fibrillation (HCC)    PONV (postoperative nausea and vomiting)    Vertigo    Past Surgical History:  Procedure Laterality Date   abscess excision     Right thigh   BENTALL PROCEDURE N/A 03/21/2017   Procedure: BENTALL PROCEDURE;  Surgeon: Gaye Pollack, MD;  Location: [facility] OR;  Service: Open Heart Surgery;  Laterality: N/A;  CIRC ARREST  LEFT RADIAL A-LINE   ENDOMETRIAL ABLATION     HERNIA REPAIR     KNEE SURGERY     Multiple   LAPAROSCOPY     x2   MEDIASTINAL EXPLORATION N/A 03/21/2017   Procedure: MEDIASTINAL EXPLORATION FOR BLEEDING;  Surgeon: Gaye Pollack, MD;  Location: [place] OR;  Service: Thoracic;  Laterality: N/A;   TEE WITHOUT CARDIOVERSION N/A 03/21/2017   Procedure: TRANSESOPHAGEAL ECHOCARDIOGRAM (TEE);  Surgeon: Gaye Pollack, MD;  Location: [place];  Service: Open Heart Surgery;  Laterality: N/A;   TUBAL LIGATION     VENTRICULAR ANEURYSM RESECTION N/A 03/21/2017   Procedure: RESECTION OF ASCENDING ANEURYSM;  Surgeon: Gaye Pollack, MD;  Location: [place];  Service: Open Heart Surgery;  Laterality: N/A;  Allergies  Allergies  Allergen Reactions   Penicillins Hives and Other (See Comments)    Has patient had a PCN reaction causing immediate rash, facial/tongue/throat swelling, SOB or lightheadedness with hypotension: unknown Has patient had a PCN reaction causing severe rash involving mucus membranes or skin necrosis:unknown Has patient had a PCN reaction that required hospitalization no Has patient had a PCN reaction occurring within the last 10 years: no If all of the above answers are "NO", then may proceed with Cephalosporin use.  REACTION: hives     EKGs/Labs/Other Studies Reviewed:   The following studies were reviewed  today:  Echocardiogram 06/24/2019 IMPRESSIONS     1. Left ventricular ejection fraction, by visual estimation, is 55%. The  left ventricle has normal function. Normal left ventricular size. There is  mildly increased left ventricular hypertrophy. Normal wall motion.   2. Global right ventricle has normal systolic function.The right  ventricular size is normal. No increase in right ventricular wall  thickness.   3. Left atrial size was mildly dilated.   4. Right atrial size was normal.   5. The mitral valve is normal in structure. Trace mitral valve  regurgitation. No evidence of mitral stenosis.   6. The tricuspid valve is normal in structure. Tricuspid valve  regurgitation is mild.   7. Status post Bentall procedure with replacement of ascending aorta.   8. There is a bioprosthetic aortic valve. Mean gradient 12 mmHg, no  significant stenosis. No significant regurgitation.   9. The inferior vena cava is normal in size with greater than 50%  respiratory variability, suggesting right atrial pressure of 3 mmHg.  10. The tricuspid regurgitant velocity is 2.40 m/s, and with an assumed  right atrial pressure of 3 mmHg, the estimated right ventricular systolic  pressure is normal at 26.0 mmHg.   EKG:  EKG is  ordered today.  The ekg ordered today demonstrates NSR, rate 68 bpm  Recent Labs: No results found for requested labs within last 8760 hours.  Recent Lipid Panel No results found for: CHOL, TRIG, HDL, CHOLHDL, VLDL, LDLCALC, LDLDIRECT  Home Medications   Current Meds  Medication Sig   aspirin EC 81 MG tablet Take 81 mg by mouth daily.   atorvastatin (LIPITOR) 20 MG tablet Take 20 mg by mouth daily.   FLUoxetine (PROZAC) 20 MG tablet Take 1 tablet by mouth daily.   metoprolol tartrate (LOPRESSOR) 25 MG tablet TAKE 1 TABLET (25 MG TOTAL) BY MOUTH AS NEEDED (PALPITATIONS).   Multiple Vitamin (MULTIVITAMIN WITH MINERALS) TABS tablet Take 1 tablet by mouth daily.   valACYclovir  (VALTREX) 1000 MG tablet 2 TABLETS AS NEEDED ORALLY EVERY 12 HRS AS NEEDED   VITAMIN D PO Take 1 capsule by mouth daily.     Review of Systems      All other systems reviewed and are otherwise negative except as noted above.  Physical Exam    VS:  BP 100/70 (BP Location: Right Arm, Patient Position: Sitting, Cuff Size: Normal)    Pulse 68    Ht 5\' 4"  (1.626 m)    Wt 129 lb (58.5 kg)    SpO2 99%    BMI 22.14 kg/m  , BMI Body mass index is 22.14 kg/m.  Wt Readings from Last 3 Encounters:  10/17/21 129 lb (58.5 kg)  08/01/20 128 lb (58.1 kg)  08/21/19 131 lb (59.4 kg)     GEN: Well nourished, well developed, in no acute distress. HEENT: normal. Neck: Supple, no JVD, carotid  bruits, or masses. Cardiac: RRR, no murmurs, rubs, or gallops. No clubbing, cyanosis, edema.  Radials/PT 2+ and equal bilaterally.  Respiratory:  Respirations regular and unlabored, clear to auscultation bilaterally. GI: Soft, nontender, nondistended. MS: No deformity or atrophy. Skin: Warm and dry, no rash. Neuro:  Strength and sensation are intact. Psych: Normal affect.  Assessment & Plan    AVR status post Bentall with tissue valve and ascending aortic graft 03/21/2017 -no CAD  -Echocardiogram 06/24/2019 with no AR stable mean gradient 12 mmHg, trace MR and mild TR - + murmur on exam but asymptomatic -Will plan for echo before her next appointment   PAF CHA2DS2-VASc score is 1 -No need for anticoagulation at this time -Has been well controlled on as needed metoprolol  Hyperlipidemia -increase Lipitor to 20mg   -Lipid panel in 6-8 weeks, she would prefer to get this done through PCP since its closer to her house.   Disposition: Follow up 1 year with Jenkins Rouge, MD or APP.  Signed, Elgie Collard, PA-C 10/17/2021, 4:02 PM [facility]

## 2021-11-13 ENCOUNTER — Other Ambulatory Visit: Payer: Self-pay | Admitting: Physician Assistant

## 2021-11-13 NOTE — Telephone Encounter (Signed)
Can we make sure she has a couple refills for her PRN metoprolol?  ? ?Thanks!

## 2022-04-23 ENCOUNTER — Telehealth: Payer: Self-pay | Admitting: Cardiovascular Disease

## 2022-04-23 DIAGNOSIS — Z952 Presence of prosthetic heart valve: Secondary | ICD-10-CM

## 2022-04-23 NOTE — Telephone Encounter (Signed)
Left message for patient to call back. Will order echo and will schedule when patient calls back.   Josue Hector, MD  You; Nuala Alpha, LPN 1 hour ago (84:72 PM)   Can order echo now or December and see me then TTE is for AVR post Bental

## 2022-04-23 NOTE — Telephone Encounter (Signed)
Pt is calling to ask Dr. Johnsie Cancel if she could have her echo and follow-up appt with him, sometime in Dec of this year, for she reached her deductible and her coverage will drop all-together in April of 2024, so if she needs further valve surgery, we can get this going before then.  Pt last saw Nicholes Rough PA-C back in Feb 2023.  Tessa advised at that time that she see Dr. Johnsie Cancel in one year with an echo prior to that.  She is currently scheduled for her echo on 09/18/22 and does not have her follow-up appt with Dr. Johnsie Cancel scheduled yet.  Pt is asking Dr. Johnsie Cancel if he thinks if would be acceptable for her to have the echo and see him for sometime in December of this year.   Pt aware that I will route this message to Dr. Johnsie Cancel and RN to further review and advise on.  She is aware that Dr. Kyla Balzarine RN will follow-up with her accordingly thereafter.   Pt verbalized understanding and agrees with this plan.

## 2022-04-23 NOTE — Telephone Encounter (Signed)
Patient called stating she would like to get a sooner Echocardiogram scheduled.

## 2022-05-01 NOTE — Telephone Encounter (Signed)
Patient is scheduled for echo and then she will have office visit with Dr. Johnsie Cancel in December.

## 2022-05-02 ENCOUNTER — Ambulatory Visit (HOSPITAL_COMMUNITY): Payer: BC Managed Care – PPO

## 2022-05-16 ENCOUNTER — Other Ambulatory Visit (HOSPITAL_COMMUNITY): Payer: BC Managed Care – PPO

## 2022-05-29 ENCOUNTER — Ambulatory Visit (HOSPITAL_COMMUNITY): Payer: BC Managed Care – PPO | Attending: Cardiovascular Disease

## 2022-05-29 DIAGNOSIS — Z952 Presence of prosthetic heart valve: Secondary | ICD-10-CM | POA: Diagnosis not present

## 2022-05-29 LAB — ECHOCARDIOGRAM COMPLETE
AV Mean grad: 15.3 mmHg
AV Peak grad: 26.2 mmHg
Ao pk vel: 2.56 m/s
Area-P 1/2: 3.85 cm2
MV M vel: 5.22 m/s
MV Peak grad: 109 mmHg
S' Lateral: 2.7 cm

## 2022-07-15 ENCOUNTER — Other Ambulatory Visit: Payer: Self-pay | Admitting: Cardiovascular Disease

## 2022-08-06 NOTE — Progress Notes (Signed)
Office Visit    Patient Name: Yamilett Anastos Malina Date of Encounter: 08/16/2022  PCP:  Deland Pretty, [facility]  Cardiologist:  Jenkins Rouge, MD  Advanced Practice Provider:  No care team member to display Electrophysiologist:  None   HPI    Demaya Hardge Hardwick is a 61 y.o. female with a hx of bicuspid aortic valve disease status post Bentall procedure with pericardial valve 26 mm Gelweave Valsalva graft for a sending aneurysm on 03/21/2017 with Dr. Tito Dine (see CHA2DS2-VASc score 1), anxiety presents today for follow-up visit.   06/29/2019 LDL was 137.  Started on statin in light of her tissue valve.  Echocardiogram revealed 55% EF with mild LAE, AVR with mean gradient 12 mmHg peak gradient 24 mmHg DVI normal 0.48 no AR.  Husband works for [employer] and travels all over world  Has two girls and a boy They cause her some anxiety/panic attacks  On Prozac for this She continues to be very active and does some weight training as well as a spin bike at home.  She occasionally does some yoga as well.  She has had very rare episodes of palpitations and they usually resolve with her as needed metoprolol.  She usually cannot get through all the metoprolol pills before the medication expires.  She takes them very rarely.  TTE 05/29/22 reviewed EF 60% normal AVR mean gradient 15 peak 26 no AR DVI 0.38   Going to [place] every 6 weeks to see mom who is in Hospice Her kids are now ages 43,27,25   Past Medical History    Past Medical History:  Diagnosis Date   Anxiety    Aortic stenosis    Bicuspid aortic valve    Previously seen in [place]; moderate AS and mild AR   Chronic neck pain    Congenital heart valve abnormality    Dyspnea    with exertion   Heart murmur    Hyperlipidemia    Paroxysmal atrial fibrillation (HCC)    PONV (postoperative nausea and vomiting)    Vertigo    Past Surgical History:  Procedure Laterality Date   abscess  excision     Right thigh   BENTALL PROCEDURE N/A 03/21/2017   Procedure: BENTALL PROCEDURE;  Surgeon: Gaye Pollack, MD;  Location: [place];  Service: Open Heart Surgery;  Laterality: N/A;  CIRC ARREST  LEFT RADIAL A-LINE   ENDOMETRIAL ABLATION     HERNIA REPAIR     KNEE SURGERY     Multiple   LAPAROSCOPY     x2   MEDIASTINAL EXPLORATION N/A 03/21/2017   Procedure: MEDIASTINAL EXPLORATION FOR BLEEDING;  Surgeon: Gaye Pollack, MD;  Location: [place] OR;  Service: Thoracic;  Laterality: N/A;   TEE WITHOUT CARDIOVERSION N/A 03/21/2017   Procedure: TRANSESOPHAGEAL ECHOCARDIOGRAM (TEE);  Surgeon: Gaye Pollack, MD;  Location: [place];  Service: Open Heart Surgery;  Laterality: N/A;   TUBAL LIGATION     VENTRICULAR ANEURYSM RESECTION N/A 03/21/2017   Procedure: RESECTION OF ASCENDING ANEURYSM;  Surgeon: Gaye Pollack, MD;  Location: [place];  Service: Open Heart Surgery;  Laterality: N/A;    Allergies  Allergies  Allergen Reactions   Penicillins Hives and Other (See Comments)    Has patient had a PCN reaction causing immediate rash, facial/tongue/throat swelling, SOB or lightheadedness with hypotension: unknown Has patient had a PCN reaction causing severe rash involving mucus membranes or skin necrosis:unknown Has patient had a  PCN reaction that required hospitalization no Has patient had a PCN reaction occurring within the last 10 years: no If all of the above answers are "NO", then may proceed with Cephalosporin use.  REACTION: hives     EKGs/Labs/Other Studies Reviewed:   The following studies were reviewed today:  Echocardiogram  05/09/22   IMPRESSIONS     1. Left ventricular ejection fraction, by estimation, is 60 to 65%. Left  ventricular ejection fraction by 3D volume is 60 %. The left ventricle has  normal function. The left ventricle has no regional wall motion  abnormalities. Left ventricular diastolic   parameters are consistent with Grade II diastolic dysfunction   (pseudonormalization). The average left ventricular global longitudinal  strain is -25.9 %. The global longitudinal strain is normal.   2. Right ventricular systolic function is normal. The right ventricular  size is normal. There is normal pulmonary artery systolic pressure. The  estimated right ventricular systolic pressure is 85.0 mmHg.   3. Left atrial size was mildly dilated.   4. Right atrial size was mildly dilated.   5. The mitral valve is normal in structure. Mild mitral valve  regurgitation. No evidence of mitral stenosis.   6. Tricuspid valve regurgitation is moderate.   7. Pericardial valve 26 mm Gelweave Valsalva graft . The aortic valve has  been repaired/replaced. Aortic valve regurgitation is not visualized.  There is a 26 mm pericardial valve present in the aortic position. Aortic  valve mean gradient measures 15.2  mmHg. Aortic valve Vmax measures 2.56 m/s.   8. Bentall.   9. The inferior vena cava is normal in size with greater than 50%  respiratory variability, suggesting right atrial pressure of 3 mmHg.   Comparison(s): Prior AVR mean gradient 12 mmHg in 2020.   EKG:  NSR, rate 68 bpm   Home Medications   Current Meds  Medication Sig   aspirin EC 81 MG tablet Take 81 mg by mouth daily.   atorvastatin (LIPITOR) 20 MG tablet Take 20 mg by mouth daily.   FLUoxetine (PROZAC) 20 MG tablet Take 1 tablet by mouth daily.   metoprolol tartrate (LOPRESSOR) 25 MG tablet TAKE 1 TABLET (25 MG TOTAL) BY MOUTH AS NEEDED (PALPITATIONS).   Multiple Vitamin (MULTIVITAMIN WITH MINERALS) TABS tablet Take 1 tablet by mouth daily.   nitrofurantoin, macrocrystal-monohydrate, (MACROBID) 100 MG capsule Take 100 mg by mouth 2 (two) times daily.   valACYclovir (VALTREX) 1000 MG tablet 2 TABLETS AS NEEDED ORALLY EVERY 12 HRS AS NEEDED   VITAMIN D PO Take 1 capsule by mouth daily.     Review of Systems      All other systems reviewed and are otherwise negative except as noted  above.  Physical Exam    VS:  BP 128/76   Pulse 73   Ht '5\' 4"'$  (1.626 m)   Wt 134 lb 3.2 oz (60.9 kg)   SpO2 99%   BMI 23.04 kg/m  , BMI Body mass index is 23.04 kg/m.  Wt Readings from Last 3 Encounters:  08/16/22 134 lb 3.2 oz (60.9 kg)  10/17/21 129 lb (58.5 kg)  08/01/20 128 lb (58.1 kg)    Affect appropriate Healthy:  appears stated age 44: normal Neck supple with no adenopathy JVP normal no bruits no thyromegaly Lungs clear with no wheezing and good diaphragmatic motion Heart:  S1/S2 SEM through AVR no AR  murmur, no rub, gallop or click PMI normal post sternotomy  Abdomen: benighn, BS positve, no tenderness,  no AAA no bruit.  No HSM or HJR Distal pulses intact with no bruits No edema Neuro non-focal Skin warm and dry No muscular weakness   Assessment & Plan    AVR status post Bentall with tissue valve and ascending aortic graft 03/21/2017 -no CAD  -normal function by TTE 02/26/22 - SBE prophylaxis   PAF CHA2DS2-VASc score is 1 -No need for anticoagulation at this time -Has been well controlled on as needed metoprolol  Hyperlipidemia - Lipitor to '20mg'$   - LDL 96 prior to increased dose - gets labs with primary   4. Anxiety:   - continue Prozac f/u primary    Disposition: Follow up 1 year    Signed, Jenkins Rouge, MD 08/16/2022, 10:33 AM [place]

## 2022-08-16 ENCOUNTER — Encounter: Payer: Self-pay | Admitting: Cardiovascular Disease

## 2022-08-16 ENCOUNTER — Ambulatory Visit: Payer: BC Managed Care – PPO | Attending: Cardiovascular Disease | Admitting: Cardiovascular Disease

## 2022-08-16 VITALS — BP 128/76 | HR 73 | Ht 64.0 in | Wt 134.2 lb

## 2022-08-16 DIAGNOSIS — E785 Hyperlipidemia, unspecified: Secondary | ICD-10-CM

## 2022-08-16 DIAGNOSIS — F419 Anxiety disorder, unspecified: Secondary | ICD-10-CM | POA: Diagnosis not present

## 2022-08-16 DIAGNOSIS — Z952 Presence of prosthetic heart valve: Secondary | ICD-10-CM | POA: Diagnosis not present

## 2022-08-16 NOTE — Patient Instructions (Signed)

## 2022-09-18 ENCOUNTER — Other Ambulatory Visit (HOSPITAL_COMMUNITY): Payer: BC Managed Care – PPO

## 2022-09-26 DIAGNOSIS — Z0000 Encounter for general adult medical examination without abnormal findings: Secondary | ICD-10-CM | POA: Diagnosis not present

## 2022-09-26 DIAGNOSIS — E559 Vitamin D deficiency, unspecified: Secondary | ICD-10-CM | POA: Diagnosis not present

## 2022-10-01 DIAGNOSIS — Z0000 Encounter for general adult medical examination without abnormal findings: Secondary | ICD-10-CM | POA: Diagnosis not present

## 2022-10-23 DIAGNOSIS — M542 Cervicalgia: Secondary | ICD-10-CM | POA: Diagnosis not present

## 2022-11-15 DIAGNOSIS — Z139 Encounter for screening, unspecified: Secondary | ICD-10-CM | POA: Diagnosis not present

## 2022-11-23 DIAGNOSIS — M542 Cervicalgia: Secondary | ICD-10-CM | POA: Diagnosis not present

## 2022-11-23 DIAGNOSIS — Z23 Encounter for immunization: Secondary | ICD-10-CM | POA: Diagnosis not present

## 2022-12-24 DIAGNOSIS — Z23 Encounter for immunization: Secondary | ICD-10-CM | POA: Diagnosis not present

## 2023-04-29 DIAGNOSIS — Z23 Encounter for immunization: Secondary | ICD-10-CM | POA: Diagnosis not present

## 2023-05-21 DIAGNOSIS — Z23 Encounter for immunization: Secondary | ICD-10-CM | POA: Diagnosis not present

## 2023-10-22 ENCOUNTER — Other Ambulatory Visit (HOSPITAL_BASED_OUTPATIENT_CLINIC_OR_DEPARTMENT_OTHER): Payer: Self-pay | Admitting: Registered Nurse

## 2023-10-22 ENCOUNTER — Ambulatory Visit (HOSPITAL_BASED_OUTPATIENT_CLINIC_OR_DEPARTMENT_OTHER)
Admission: RE | Admit: 2023-10-22 | Discharge: 2023-10-22 | Disposition: A | Discharge status: Home / Self Care | Payer: BC Managed Care – PPO | Source: Ambulatory Visit | Attending: Internal Medicine | Admitting: Internal Medicine

## 2023-10-22 ENCOUNTER — Encounter (HOSPITAL_BASED_OUTPATIENT_CLINIC_OR_DEPARTMENT_OTHER): Payer: Self-pay | Admitting: Radiology

## 2023-10-22 DIAGNOSIS — Z1231 Encounter for screening mammogram for malignant neoplasm of breast: Secondary | ICD-10-CM

## 2023-10-24 ENCOUNTER — Encounter: Payer: Self-pay | Admitting: Cardiology

## 2023-10-24 ENCOUNTER — Other Ambulatory Visit: Payer: Self-pay | Admitting: Cardiology

## 2023-10-24 ENCOUNTER — Ambulatory Visit (INDEPENDENT_AMBULATORY_CARE_PROVIDER_SITE_OTHER): Payer: BC Managed Care – PPO

## 2023-10-24 ENCOUNTER — Ambulatory Visit: Payer: BC Managed Care – PPO | Attending: Cardiology | Admitting: Cardiology

## 2023-10-24 VITALS — BP 120/76 | HR 69 | Ht 64.0 in | Wt 140.0 lb

## 2023-10-24 DIAGNOSIS — I480 Paroxysmal atrial fibrillation: Secondary | ICD-10-CM | POA: Diagnosis not present

## 2023-10-24 DIAGNOSIS — E785 Hyperlipidemia, unspecified: Secondary | ICD-10-CM

## 2023-10-24 DIAGNOSIS — R002 Palpitations: Secondary | ICD-10-CM

## 2023-10-24 DIAGNOSIS — Z952 Presence of prosthetic heart valve: Secondary | ICD-10-CM

## 2023-10-24 MED ORDER — ATORVASTATIN CALCIUM 20 MG PO TABS: 20.0000 mg | ORAL_TABLET | Freq: Every day | ORAL | 3 refills | Status: DC

## 2023-10-24 MED ORDER — METOPROLOL TARTRATE 25 MG PO TABS: 25.0000 mg | ORAL_TABLET | ORAL | 5 refills | Status: DC | PRN

## 2023-10-24 NOTE — Progress Notes (Signed)
 Cardiology Office Note:   Date:  10/28/2023  ID:  Jessica [name], DOB November 08, 1960, MRN [id number] PCP: Merri Brunette, MD  [facility] Providers Cardiologist:  Charlton Haws, MD    History of Present Illness:   Discussed the use of AI scribe software for clinical note transcription with the patient, who gave verbal consent to proceed.  Jessica [name] is a 63 year old female with hx bicuspid aortic valve disease, s/p Bentall procedure with pericardial valve 26mm Gelweave Valsalva graft for ascending aneurysm (Dr. Laneta Simmers 2018), post procedure atrial fibrillation who presents for follow up.   The last year has been difficult emotionally for her with frequent travel out to state to take care of her elderly/ill mother. She recently passed away. Patient reports upcoming trips to help her sister work through their late [employer].  She says that she has been experiencing difficulty in taking deep breaths over the past few months, described as often needing to yawn to achieve a full breath. Her most recent echocardiogram in September 2023 showed a mean valve gradient of 15.2 mmHg and a peak gradient of 26.2 mmHg, with no aortic valve regurgitation. She reports no issues with orthopnea or peripheral edema. She denies any significant problems with exertion, although she has not been exercising regularly due to recent travel and personal commitments.  She experiences chronic dizziness and vertigo describing it as a 'tunnel vision' sensation at times but also like feeling like her eyes are crossing. These episodes are triggered by rapid head movements, getting up too quickly, and stress. Stress, in particular, seems to exacerbate her symptoms, and she has experienced dizziness while driving, which has become a stressor itself.  She has a history of paroxysmal atrial fibrillation, which occurred postoperatively following a dental procedure, but she was not placed on anticoagulation due  to a CHADS-2 VASc score of one. She experiences palpitations occasionally, a couple of times a week, but notes that they are not as severe as during her previous atrial fibrillation episodes. She takes metoprolol as needed, although she has not been using it frequently/doesn't feel it has been needed.  Her last recorded LDL was 120 mg/dL in January 2022, prompting the initiation of atorvastatin 20 mg. 99 mg/dL as of January 2130. She denies any muscle cramps or side effects from the medication and tries to avoid fried and fatty foods. She has a family history of high cholesterol.   Studies Reviewed:    EKG:   EKG Interpretation Date/Time:  Thursday October 24 2023 13:31:51 EST Ventricular Rate:  69 PR Interval:  142 QRS Duration:  84 QT Interval:  396 QTC Calculation: 424 R Axis:   45  Text Interpretation: Normal sinus rhythm with sinus arrhythmia Normal ECG Confirmed by Perlie Gold [phone]) on 10/24/2023 1:35:17 PM    05/29/22 TTE  IMPRESSIONS     1. Left ventricular ejection fraction, by estimation, is 60 to 65%. Left ventricular ejection fraction by 3D volume is 60 %. The left ventricle has normal function. The left ventricle has no regional wall motion abnormalities. Left ventricular diastolic parameters are consistent with Grade II diastolic dysfunction (pseudonormalization). The average left ventricular global longitudinal strain is -25.9 %. The global longitudinal strain is normal. 2. Right ventricular systolic function is normal. The right ventricular size is normal. There is normal pulmonary artery systolic pressure. The estimated right ventricular systolic pressure is 27.0 mmHg. 3. Left atrial size was mildly dilated. 4. Right atrial size was mildly dilated. 5. The  mitral valve is normal in structure. Mild mitral valve regurgitation. No evidence of mitral stenosis. 6. Tricuspid valve regurgitation is moderate. 7. Pericardial valve 26 mm Gelweave Valsalva graft . The aortic  valve has been repaired/replaced. Aortic valve regurgitation is not visualized. There is a 26 mm pericardial valve present in the aortic position. Aortic valve mean gradient measures 15.2 mmHg. Aortic valve Vmax measures 2.56 m/s. 8. Bentall. 9. The inferior vena cava is normal in size with greater than 50% respiratory variability, suggesting right atrial pressure of 3 mmHg.   Comparison(s): Prior AVR mean gradient 12 mmHg in 2020.   FINDINGS Left Ventricle: Left ventricular ejection fraction, by estimation, is 60 to 65%. Left ventricular ejection fraction by 3D volume is 60 %. The left ventricle has normal function. The left ventricle has no regional wall motion abnormalities. The average left ventricular global longitudinal strain is -25.9 %. The global longitudinal strain is normal. The left ventricular internal cavity size was normal in size. There is no left ventricular hypertrophy. Left ventricular diastolic parameters are consistent with Grade II diastolic dysfunction (pseudonormalization).   Right Ventricle: The right ventricular size is normal. No increase in right ventricular wall thickness. Right ventricular systolic function is normal. There is normal pulmonary artery systolic pressure. The tricuspid regurgitant velocity is 2.45 m/s, and with an assumed right atrial pressure of 3 mmHg, the estimated right ventricular systolic pressure is 27.0 mmHg.   Left Atrium: Left atrial size was mildly dilated.   Right Atrium: Right atrial size was mildly dilated.   Pericardium: There is no evidence of pericardial effusion.   Mitral Valve: The mitral valve is normal in structure. Mild mitral valve regurgitation. No evidence of mitral valve stenosis.   Tricuspid Valve: The tricuspid valve is normal in structure. Tricuspid valve regurgitation is moderate . No evidence of tricuspid stenosis.   Aortic Valve: Pericardial valve 26 mm Gelweave Valsalva graft. The aortic valve has been  repaired/replaced. Aortic valve regurgitation is not visualized. Aortic valve mean gradient measures 15.2 mmHg. Aortic valve peak gradient measures 26.2 mmHg. There is a 26 mm pericardial valve present in the aortic position.   Pulmonic Valve: The pulmonic valve was normal in structure. Pulmonic valve regurgitation is not visualized. No evidence of pulmonic stenosis.   Aorta: Bentall. The aortic root is normal in size and structure.   Venous: The inferior vena cava is normal in size with greater than 50% respiratory variability, suggesting right atrial pressure of 3 mmHg.   IAS/Shunts: No atrial level shunt detected by color flow Doppler.  Risk Assessment/Calculations:    CHA2DS2-VASc Score = 1   This indicates a 0.6% annual risk of stroke. The patient's score is based upon: CHF History: 0 HTN History: 0 Diabetes History: 0 Stroke History: 0 Vascular Disease History: 0 Age Score: 0 Gender Score: 1      Physical Exam:   VS:  BP 120/76   Pulse 69   Ht 5\' 4"  (1.626 m)   Wt 140 lb (63.5 kg)   SpO2 98%   BMI 24.03 kg/m    Wt Readings from Last 3 Encounters:  10/24/23 140 lb (63.5 kg)  08/16/22 134 lb 3.2 oz (60.9 kg)  10/17/21 129 lb (58.5 kg)     Physical Exam Vitals reviewed.  Constitutional:      Appearance: Normal appearance.  HENT:     Head: Normocephalic.     Nose: Nose normal.  Eyes:     Pupils: Pupils are equal, round, and reactive  to light.  Cardiovascular:     Rate and Rhythm: Normal rate and regular rhythm.     Pulses: Normal pulses.     Heart sounds: Murmur (3/6 systolic over RUSB) heard.     No friction rub. No gallop.  Pulmonary:     Effort: Pulmonary effort is normal.     Breath sounds: Normal breath sounds.  Abdominal:     General: Abdomen is flat.  Musculoskeletal:     Right lower leg: No edema.     Left lower leg: No edema.  Skin:    General: Skin is warm and dry.     Capillary Refill: Capillary refill takes less than 2 seconds.   Neurological:     General: No focal deficit present.     Mental Status: She is alert and oriented to person, place, and time.  Psychiatric:        Mood and Affect: Mood normal.        Behavior: Behavior normal.        Thought Content: Thought content normal.        Judgment: Judgment normal.       ASSESSMENT AND PLAN:     Assessment and Plan    Aortic Stenosis Status Post Bentall Procedure No aortic valve regurgitation visualized on 2023 echocardiogram. Mean gradient of 15.2 and peak gradient of 26.2. No symptoms suggestive of valve dysfunction reported. -Plan to repeat echocardiogram to monitor valve function.  Dyspnea Reports difficulty taking deep breaths for the past couple of months. No exertional dyspnea reported. -Given that stress seems to be a primary trigger and patient has no exertional limitations or orthopnea, no clear cardiac correlation. -Plan to evaluate with echocardiogram and heart monitor.  Dizziness Reports episodes of tunnel vision and vertigo, triggered by rapid head movements, standing up quickly, and stress. Episodes occurring a couple of times a week. -Plan to evaluate with heart monitor for 7 days to assess for arrhythmias or early beats. Overall symptoms sound most consistent with vestibular dysfunction rather than primary cardiac dizziness.  Hyperlipidemia On Atorvastatin 20mg . LDL 99 as of January 2024. Has upcoming labs with PCP. -Plan to continue Atorvastatin 20mg .  Palpitations Infrequent symptoms reported. Patient has Metoprolol that she can take PRN but reports that she takes it so infrequently that she needs a new Rx, current supply is expired. ECG today stable without arrhythmia. -Continur PRN Metoprolol. -Heart monitor as above.   Paroxysmal Atrial Fibrillation No recent symptoms of AFib reported. -Continue Metoprolol as needed. -Plan to evaluate dizziness as noted above with heart monitor for 7 days to assess for recurrent  AFib.  Follow-up in 1 year unless needed sooner.             Signed, Perlie Gold, PA-C

## 2023-10-24 NOTE — Progress Notes (Unsigned)
ZIO serial # [id number] from office inventory applied to patient.  Dr. Eden Emms to read.

## 2023-10-24 NOTE — Patient Instructions (Signed)
Medication Instructions:  Your physician recommends that you continue on your current medications as directed. Please refer to the Current Medication list given to you today.  *If you need a refill on your cardiac medications before your next appointment, please call your pharmacy*   Lab Work: NONE  If you have labs (blood work) drawn today and your tests are completely normal, you will receive your results only by: MyChart Message (if you have MyChart) OR A paper copy in the mail If you have any lab test that is abnormal or we need to change your treatment, we will call you to review the results.   Testing/Procedures: Your physician has requested that you have an echocardiogram. Echocardiography is a painless test that uses sound waves to create images of your heart. It provides your doctor with information about the size and shape of your heart and how well your heart's chambers and valves are working. This procedure takes approximately one hour. There are no restrictions for this procedure. Please do NOT wear cologne, perfume, aftershave, or lotions (deodorant is allowed). Please arrive 15 minutes prior to your appointment time.  Please note: We ask at that you not bring children with you during ultrasound (echo/ vascular) testing. Due to room size and safety concerns, children are not allowed in the ultrasound rooms during exams. Our front office staff cannot provide observation of children in our lobby area while testing is being conducted. An adult accompanying a patient to their appointment will only be allowed in the ultrasound room at the discretion of the ultrasound technician under special circumstances. We apologize for any inconvenience.   Your physician has requested that you wear a heart monitor.    Follow-Up: At [facility], you and your health needs are our priority.  As part of our continuing mission to provide you with exceptional heart care, we have created  designated Provider Care Teams.  These Care Teams include your primary Cardiologist (physician) and Advanced Practice Providers (APPs -  Physician Assistants and Nurse Practitioners) who all work together to provide you with the care you need, when you need it.    Your next appointment:   1 year(s)  Provider:   Charlton Haws, MD  or Perlie Gold, PA-C      Other Instructions Christena Deem- Long Term Monitor Instructions  Your physician has requested you wear a ZIO patch monitor for 7 days.  This is a single patch monitor. [employer] supplies one patch monitor per enrollment. Additional stickers are not available. Please do not apply patch if you will be having a Nuclear Stress Test,   Cardiac CT, MRI, or Chest Xray during the period you would be wearing the  monitor. The patch cannot be worn during these tests. You cannot remove and re-apply the  ZIO XT patch monitor.  Your ZIO patch monitor will be mailed 3 day USPS to your address on file. It may take 3-5 days  to receive your monitor after you have been enrolled.  Once you have received your monitor, please review the enclosed instructions. Your monitor  has already been registered assigning a specific monitor serial # to you.  Billing and Patient Assistance Program Information  We have supplied [employer] with any of your insurance information on file for billing purposes. [employer] offers a sliding scale Patient Assistance Program for patients that do not have  insurance, or whose insurance does not completely cover the cost of the ZIO monitor.  You must apply for the Patient  Assistance Program to qualify for this discounted rate.  To apply, please call [employer] at [phone], select option 4, select option 2, ask to apply for  Patient Assistance Program. Meredeth Ide will ask your household income, and how many people  are in your household. They will quote your out-of-pocket cost based on that information.  [employer] will also be able to set up a  40-month, interest-free payment plan if needed.  Applying the monitor   Shave hair from upper left chest.  Hold abrader disc by orange tab. Rub abrader in 40 strokes over the upper left chest as  indicated in your monitor instructions.  Clean area with 4 enclosed alcohol pads. Let dry.  Apply patch as indicated in monitor instructions. Patch will be placed under collarbone on left  side of chest with arrow pointing upward.  Rub patch adhesive wings for 2 minutes. Remove white label marked "1". Remove the white  label marked "2". Rub patch adhesive wings for 2 additional minutes.  While looking in a mirror, press and release button in center of patch. A small green light will  flash 3-4 times. This will be your only indicator that the monitor has been turned on.  Do not shower for the first 24 hours. You may shower after the first 24 hours.  Press the button if you feel a symptom. You will hear a small click. Record Date, Time and  Symptom in the Patient Logbook.  When you are ready to remove the patch, follow instructions on the last 2 pages of Patient  Logbook. Stick patch monitor onto the last page of Patient Logbook.  Place Patient Logbook in the blue and white box. Use locking tab on box and tape box closed  securely. The blue and white box has prepaid postage on it. Please place it in the mailbox as  soon as possible. Your physician should have your test results approximately 7 days after the  monitor has been mailed back to [facility].  Call [facility] Customer Care at [phone] if you have questions regarding  your ZIO XT patch monitor. Call them immediately if you see an orange light blinking on your  monitor.  If your monitor falls off in less than 4 days, contact our Monitor department at [phone].  If your monitor becomes loose or falls off after 4 days call [employer] at [phone] for  suggestions on securing your monitor

## 2023-10-28 DIAGNOSIS — E559 Vitamin D deficiency, unspecified: Secondary | ICD-10-CM | POA: Diagnosis not present

## 2023-10-28 DIAGNOSIS — R0989 Other specified symptoms and signs involving the circulatory and respiratory systems: Secondary | ICD-10-CM | POA: Diagnosis not present

## 2023-10-28 DIAGNOSIS — J029 Acute pharyngitis, unspecified: Secondary | ICD-10-CM | POA: Diagnosis not present

## 2023-10-28 DIAGNOSIS — Z0000 Encounter for general adult medical examination without abnormal findings: Secondary | ICD-10-CM | POA: Diagnosis not present

## 2023-10-28 DIAGNOSIS — E782 Mixed hyperlipidemia: Secondary | ICD-10-CM | POA: Diagnosis not present

## 2023-10-28 LAB — LAB REPORT - SCANNED: EGFR: 64

## 2023-11-08 ENCOUNTER — Encounter: Payer: Self-pay | Admitting: *Deleted

## 2023-11-13 ENCOUNTER — Encounter: Payer: Self-pay | Admitting: Registered Nurse

## 2023-11-13 DIAGNOSIS — Z01419 Encounter for gynecological examination (general) (routine) without abnormal findings: Secondary | ICD-10-CM | POA: Diagnosis not present

## 2023-11-13 DIAGNOSIS — Z23 Encounter for immunization: Secondary | ICD-10-CM | POA: Diagnosis not present

## 2023-11-13 DIAGNOSIS — Z0000 Encounter for general adult medical examination without abnormal findings: Secondary | ICD-10-CM | POA: Diagnosis not present

## 2023-11-14 ENCOUNTER — Telehealth: Payer: Self-pay | Admitting: Cardiology

## 2023-11-14 MED ORDER — ATORVASTATIN CALCIUM 40 MG PO TABS: 40.0000 mg | ORAL_TABLET | Freq: Every day | ORAL | 3 refills | Status: AC

## 2023-11-14 NOTE — Telephone Encounter (Signed)
 Spoke with patient and she is aware of lab results. She will increase her atorvastatin and repeat labs at PCP offince in 8 weeks.

## 2023-11-14 NOTE — Telephone Encounter (Signed)
 Pt returning call for lab results

## 2023-11-21 ENCOUNTER — Ambulatory Visit (HOSPITAL_COMMUNITY): Payer: BC Managed Care – PPO | Attending: Cardiovascular Disease

## 2023-11-21 DIAGNOSIS — Z952 Presence of prosthetic heart valve: Secondary | ICD-10-CM | POA: Diagnosis not present

## 2023-11-21 DIAGNOSIS — R002 Palpitations: Secondary | ICD-10-CM | POA: Insufficient documentation

## 2023-11-21 LAB — ECHOCARDIOGRAM COMPLETE
AV Mean grad: 15 mmHg
AV Peak grad: 23.2 mmHg
Ao pk vel: 2.41 m/s
Area-P 1/2: 4.89 cm2
S' Lateral: 2.9 cm

## 2023-11-25 ENCOUNTER — Encounter: Payer: Self-pay | Admitting: *Deleted

## 2024-01-01 DIAGNOSIS — R0609 Other forms of dyspnea: Secondary | ICD-10-CM | POA: Diagnosis not present

## 2024-01-01 DIAGNOSIS — Z952 Presence of prosthetic heart valve: Secondary | ICD-10-CM | POA: Diagnosis not present

## 2024-01-01 DIAGNOSIS — Z87891 Personal history of nicotine dependence: Secondary | ICD-10-CM | POA: Diagnosis not present

## 2024-01-01 DIAGNOSIS — I480 Paroxysmal atrial fibrillation: Secondary | ICD-10-CM | POA: Diagnosis not present

## 2024-01-02 ENCOUNTER — Other Ambulatory Visit (HOSPITAL_COMMUNITY): Payer: Self-pay | Admitting: Radiology

## 2024-01-02 DIAGNOSIS — E782 Mixed hyperlipidemia: Secondary | ICD-10-CM | POA: Diagnosis not present

## 2024-01-02 DIAGNOSIS — R0609 Other forms of dyspnea: Secondary | ICD-10-CM

## 2024-01-14 DIAGNOSIS — F1020 Alcohol dependence, uncomplicated: Secondary | ICD-10-CM | POA: Diagnosis not present

## 2024-01-21 DIAGNOSIS — F1020 Alcohol dependence, uncomplicated: Secondary | ICD-10-CM | POA: Diagnosis not present

## 2024-01-28 ENCOUNTER — Ambulatory Visit (HOSPITAL_COMMUNITY)
Admission: RE | Admit: 2024-01-28 | Discharge: 2024-01-28 | Disposition: A | Discharge status: Home / Self Care | Source: Ambulatory Visit | Attending: Registered Nurse | Admitting: Registered Nurse

## 2024-01-28 DIAGNOSIS — R0609 Other forms of dyspnea: Secondary | ICD-10-CM | POA: Insufficient documentation

## 2024-01-28 LAB — PULMONARY FUNCTION TEST
DL/VA % pred: 80 %
DL/VA: 3.38 ml/min/mmHg/L
DLCO unc % pred: 82 %
DLCO unc: 16.63 ml/min/mmHg
FEF 25-75 Post: 2.03 L/s
FEF 25-75 Pre: 1.69 L/s
FEF2575-%Change-Post: 20 %
FEF2575-%Pred-Post: 87 %
FEF2575-%Pred-Pre: 73 %
FEV1-%Change-Post: 4 %
FEV1-%Pred-Post: 98 %
FEV1-%Pred-Pre: 94 %
FEV1-Post: 2.5 L
FEV1-Pre: 2.38 L
FEV1FVC-%Change-Post: 1 %
FEV1FVC-%Pred-Pre: 92 %
FEV6-%Change-Post: 1 %
FEV6-%Pred-Post: 105 %
FEV6-%Pred-Pre: 103 %
FEV6-Post: 3.33 L
FEV6-Pre: 3.28 L
FEV6FVC-%Change-Post: -1 %
FEV6FVC-%Pred-Post: 101 %
FEV6FVC-%Pred-Pre: 102 %
FVC-%Change-Post: 3 %
FVC-%Pred-Post: 104 %
FVC-%Pred-Pre: 101 %
FVC-Post: 3.42 L
FVC-Pre: 3.31 L
Post FEV1/FVC ratio: 73 %
Post FEV6/FVC ratio: 98 %
Pre FEV1/FVC ratio: 72 %
Pre FEV6/FVC Ratio: 99 %
RV % pred: 108 %
RV: 2.18 L
TLC % pred: 107 %
TLC: 5.44 L

## 2024-01-28 MED ORDER — ALBUTEROL SULFATE (2.5 MG/3ML) 0.083% IN NEBU
2.5000 mg | INHALATION_SOLUTION | Freq: Once | RESPIRATORY_TRACT | Status: AC
  Administered 2024-01-28: 2.5 mg via RESPIRATORY_TRACT

## 2024-01-29 DIAGNOSIS — F1020 Alcohol dependence, uncomplicated: Secondary | ICD-10-CM | POA: Diagnosis not present

## 2024-02-12 DIAGNOSIS — Z23 Encounter for immunization: Secondary | ICD-10-CM | POA: Diagnosis not present

## 2024-02-18 DIAGNOSIS — F1020 Alcohol dependence, uncomplicated: Secondary | ICD-10-CM | POA: Diagnosis not present

## 2024-02-27 DIAGNOSIS — F1020 Alcohol dependence, uncomplicated: Secondary | ICD-10-CM | POA: Diagnosis not present

## 2024-03-03 DIAGNOSIS — F1020 Alcohol dependence, uncomplicated: Secondary | ICD-10-CM | POA: Diagnosis not present

## 2024-03-17 DIAGNOSIS — F1020 Alcohol dependence, uncomplicated: Secondary | ICD-10-CM | POA: Diagnosis not present

## 2024-03-24 DIAGNOSIS — F1020 Alcohol dependence, uncomplicated: Secondary | ICD-10-CM | POA: Diagnosis not present

## 2024-04-07 DIAGNOSIS — F1020 Alcohol dependence, uncomplicated: Secondary | ICD-10-CM | POA: Diagnosis not present

## 2024-04-22 ENCOUNTER — Other Ambulatory Visit: Payer: Self-pay | Admitting: Cardiology

## 2024-06-30 DIAGNOSIS — F1020 Alcohol dependence, uncomplicated: Secondary | ICD-10-CM | POA: Diagnosis not present

## 2024-07-07 DIAGNOSIS — F1020 Alcohol dependence, uncomplicated: Secondary | ICD-10-CM | POA: Diagnosis not present

## 2024-07-21 DIAGNOSIS — F1020 Alcohol dependence, uncomplicated: Secondary | ICD-10-CM | POA: Diagnosis not present

## 2024-08-04 DIAGNOSIS — F1020 Alcohol dependence, uncomplicated: Secondary | ICD-10-CM | POA: Diagnosis not present

## 2024-08-13 DIAGNOSIS — F1020 Alcohol dependence, uncomplicated: Secondary | ICD-10-CM | POA: Diagnosis not present
